# Patient Record
Sex: Female | Born: 1970 | Hispanic: No | Marital: Married | Smoking: Never smoker
Health system: Southern US, Community
[De-identification: ages and names within clinical notes are randomized; demographics above are authoritative.]

## PROBLEM LIST (undated history)

## (undated) DIAGNOSIS — J329 Chronic sinusitis, unspecified: Secondary | ICD-10-CM

## (undated) DIAGNOSIS — D72819 Decreased white blood cell count, unspecified: Secondary | ICD-10-CM

## (undated) DIAGNOSIS — C50919 Malignant neoplasm of unspecified site of unspecified female breast: Secondary | ICD-10-CM

## (undated) DIAGNOSIS — F419 Anxiety disorder, unspecified: Secondary | ICD-10-CM

## (undated) DIAGNOSIS — Z923 Personal history of irradiation: Secondary | ICD-10-CM

## (undated) DIAGNOSIS — F32A Depression, unspecified: Secondary | ICD-10-CM

## (undated) DIAGNOSIS — J9801 Acute bronchospasm: Secondary | ICD-10-CM

## (undated) DIAGNOSIS — Z8489 Family history of other specified conditions: Secondary | ICD-10-CM

## (undated) DIAGNOSIS — F329 Major depressive disorder, single episode, unspecified: Secondary | ICD-10-CM

## (undated) DIAGNOSIS — D709 Neutropenia, unspecified: Secondary | ICD-10-CM

## (undated) DIAGNOSIS — N870 Mild cervical dysplasia: Secondary | ICD-10-CM

## (undated) DIAGNOSIS — M8430XA Stress fracture, unspecified site, initial encounter for fracture: Secondary | ICD-10-CM

## (undated) HISTORY — DX: Personal history of irradiation: Z92.3

## (undated) HISTORY — DX: Malignant neoplasm of unspecified site of unspecified female breast: C50.919

## (undated) HISTORY — DX: Stress fracture, unspecified site, initial encounter for fracture: M84.30XA

## (undated) HISTORY — DX: Chronic sinusitis, unspecified: J32.9

## (undated) HISTORY — PX: COLPOSCOPY: SHX161

## (undated) HISTORY — DX: Depression, unspecified: F32.A

## (undated) HISTORY — DX: Neutropenia, unspecified: D70.9

## (undated) HISTORY — DX: Major depressive disorder, single episode, unspecified: F32.9

## (undated) HISTORY — DX: Anxiety disorder, unspecified: F41.9

## (undated) HISTORY — DX: Decreased white blood cell count, unspecified: D72.819

## (undated) HISTORY — DX: Mild cervical dysplasia: N87.0

## (undated) HISTORY — DX: Acute bronchospasm: J98.01

---
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
## 1997-08-24 ENCOUNTER — Other Ambulatory Visit: Admission: RE | Admit: 1997-08-24 | Discharge: 1997-08-24 | Payer: Self-pay | Admitting: Obstetrics and Gynecology

## 1998-09-05 ENCOUNTER — Other Ambulatory Visit: Admission: RE | Admit: 1998-09-05 | Discharge: 1998-09-05 | Payer: Self-pay | Admitting: Obstetrics and Gynecology

## 1999-09-12 ENCOUNTER — Other Ambulatory Visit: Admission: RE | Admit: 1999-09-12 | Discharge: 1999-09-12 | Payer: Self-pay | Admitting: Obstetrics and Gynecology

## 2000-09-12 ENCOUNTER — Other Ambulatory Visit: Admission: RE | Admit: 2000-09-12 | Discharge: 2000-09-12 | Payer: Self-pay | Admitting: Obstetrics and Gynecology

## 2001-10-03 ENCOUNTER — Other Ambulatory Visit: Admission: RE | Admit: 2001-10-03 | Discharge: 2001-10-03 | Payer: Self-pay | Admitting: Obstetrics and Gynecology

## 2004-04-27 ENCOUNTER — Other Ambulatory Visit: Admission: RE | Admit: 2004-04-27 | Discharge: 2004-04-27 | Payer: Self-pay | Admitting: Obstetrics and Gynecology

## 2004-09-12 ENCOUNTER — Other Ambulatory Visit: Admission: RE | Admit: 2004-09-12 | Discharge: 2004-09-12 | Payer: Self-pay | Admitting: Obstetrics and Gynecology

## 2005-05-07 ENCOUNTER — Other Ambulatory Visit: Admission: RE | Admit: 2005-05-07 | Discharge: 2005-05-07 | Payer: Self-pay | Admitting: Obstetrics and Gynecology

## 2005-11-08 ENCOUNTER — Other Ambulatory Visit: Admission: RE | Admit: 2005-11-08 | Discharge: 2005-11-08 | Payer: Self-pay | Admitting: Obstetrics and Gynecology

## 2006-05-09 ENCOUNTER — Other Ambulatory Visit: Admission: RE | Admit: 2006-05-09 | Discharge: 2006-05-09 | Payer: Self-pay | Admitting: Obstetrics and Gynecology

## 2007-05-12 ENCOUNTER — Other Ambulatory Visit: Admission: RE | Admit: 2007-05-12 | Discharge: 2007-05-12 | Payer: Self-pay | Admitting: Obstetrics and Gynecology

## 2008-05-18 ENCOUNTER — Ambulatory Visit: Payer: Self-pay | Admitting: Obstetrics and Gynecology

## 2008-05-18 ENCOUNTER — Encounter: Payer: Self-pay | Admitting: Obstetrics and Gynecology

## 2008-05-18 ENCOUNTER — Other Ambulatory Visit: Admission: RE | Admit: 2008-05-18 | Discharge: 2008-05-18 | Payer: Self-pay | Admitting: Obstetrics and Gynecology

## 2009-01-29 DIAGNOSIS — M8430XA Stress fracture, unspecified site, initial encounter for fracture: Secondary | ICD-10-CM

## 2009-01-29 HISTORY — DX: Stress fracture, unspecified site, initial encounter for fracture: M84.30XA

## 2009-04-05 ENCOUNTER — Encounter: Admission: RE | Admit: 2009-04-05 | Discharge: 2009-04-05 | Payer: Self-pay | Admitting: Sports Medicine

## 2009-05-24 ENCOUNTER — Other Ambulatory Visit: Admission: RE | Admit: 2009-05-24 | Discharge: 2009-05-24 | Payer: Self-pay | Admitting: Obstetrics and Gynecology

## 2009-05-24 ENCOUNTER — Ambulatory Visit: Payer: Self-pay | Admitting: Obstetrics and Gynecology

## 2009-09-06 ENCOUNTER — Ambulatory Visit: Payer: Self-pay | Admitting: Obstetrics and Gynecology

## 2010-03-07 ENCOUNTER — Other Ambulatory Visit: Payer: Self-pay

## 2010-03-07 DIAGNOSIS — M549 Dorsalgia, unspecified: Secondary | ICD-10-CM

## 2010-03-07 DIAGNOSIS — M25552 Pain in left hip: Secondary | ICD-10-CM

## 2010-03-10 ENCOUNTER — Ambulatory Visit
Admission: RE | Admit: 2010-03-10 | Discharge: 2010-03-10 | Disposition: A | Discharge status: Home / Self Care | Payer: Commercial Managed Care - PPO | Source: Ambulatory Visit

## 2010-03-10 DIAGNOSIS — M549 Dorsalgia, unspecified: Secondary | ICD-10-CM

## 2010-03-10 DIAGNOSIS — M25552 Pain in left hip: Secondary | ICD-10-CM

## 2010-05-29 ENCOUNTER — Other Ambulatory Visit: Payer: Self-pay | Admitting: Obstetrics and Gynecology

## 2010-05-29 ENCOUNTER — Encounter (INDEPENDENT_AMBULATORY_CARE_PROVIDER_SITE_OTHER): Payer: 59 | Admitting: Obstetrics and Gynecology

## 2010-05-29 ENCOUNTER — Other Ambulatory Visit (HOSPITAL_COMMUNITY)
Admission: RE | Admit: 2010-05-29 | Discharge: 2010-05-29 | Disposition: A | Discharge status: Home / Self Care | Payer: 59 | Source: Ambulatory Visit | Attending: Obstetrics and Gynecology | Admitting: Obstetrics and Gynecology

## 2010-05-29 DIAGNOSIS — Z13220 Encounter for screening for lipoid disorders: Secondary | ICD-10-CM

## 2010-05-29 DIAGNOSIS — Z124 Encounter for screening for malignant neoplasm of cervix: Secondary | ICD-10-CM | POA: Insufficient documentation

## 2010-05-29 DIAGNOSIS — R82998 Other abnormal findings in urine: Secondary | ICD-10-CM

## 2010-05-29 DIAGNOSIS — Z01419 Encounter for gynecological examination (general) (routine) without abnormal findings: Secondary | ICD-10-CM

## 2010-05-29 DIAGNOSIS — Z833 Family history of diabetes mellitus: Secondary | ICD-10-CM

## 2010-06-30 ENCOUNTER — Other Ambulatory Visit: Payer: 59

## 2011-05-23 ENCOUNTER — Encounter: Payer: Self-pay | Admitting: Gynecology

## 2011-05-23 DIAGNOSIS — M8430XA Stress fracture, unspecified site, initial encounter for fracture: Secondary | ICD-10-CM | POA: Insufficient documentation

## 2011-05-23 DIAGNOSIS — N870 Mild cervical dysplasia: Secondary | ICD-10-CM | POA: Insufficient documentation

## 2011-05-30 ENCOUNTER — Other Ambulatory Visit (HOSPITAL_COMMUNITY)
Admission: RE | Admit: 2011-05-30 | Discharge: 2011-05-30 | Disposition: A | Discharge status: Home / Self Care | Payer: 59 | Source: Ambulatory Visit | Attending: Obstetrics and Gynecology | Admitting: Obstetrics and Gynecology

## 2011-05-30 ENCOUNTER — Other Ambulatory Visit: Payer: Self-pay | Admitting: Obstetrics and Gynecology

## 2011-05-30 ENCOUNTER — Ambulatory Visit (INDEPENDENT_AMBULATORY_CARE_PROVIDER_SITE_OTHER): Payer: 59 | Admitting: Obstetrics and Gynecology

## 2011-05-30 ENCOUNTER — Encounter: Payer: Self-pay | Admitting: Obstetrics and Gynecology

## 2011-05-30 VITALS — BP 120/74 | Ht 66.5 in | Wt 148.0 lb

## 2011-05-30 DIAGNOSIS — J329 Chronic sinusitis, unspecified: Secondary | ICD-10-CM | POA: Insufficient documentation

## 2011-05-30 DIAGNOSIS — Z833 Family history of diabetes mellitus: Secondary | ICD-10-CM

## 2011-05-30 DIAGNOSIS — J9801 Acute bronchospasm: Secondary | ICD-10-CM | POA: Insufficient documentation

## 2011-05-30 DIAGNOSIS — Z1231 Encounter for screening mammogram for malignant neoplasm of breast: Secondary | ICD-10-CM

## 2011-05-30 DIAGNOSIS — Z01419 Encounter for gynecological examination (general) (routine) without abnormal findings: Secondary | ICD-10-CM

## 2011-05-30 DIAGNOSIS — Z113 Encounter for screening for infections with a predominantly sexual mode of transmission: Secondary | ICD-10-CM

## 2011-05-30 LAB — RPR

## 2011-05-30 LAB — HIV ANTIBODY (ROUTINE TESTING W REFLEX): HIV: NONREACTIVE

## 2011-05-30 MED ORDER — SERTRALINE HCL 50 MG PO TABS: 50.0000 mg | ORAL_TABLET | Freq: Every day | ORAL | Status: DC

## 2011-05-30 MED ORDER — ETONOGESTREL-ETHINYL ESTRADIOL 0.12-0.015 MG/24HR VA RING: VAGINAL_RING | VAGINAL | Status: DC

## 2011-05-30 NOTE — Progress Notes (Signed)
Patient came to see me today for her annual GYN exam. She is suspicious that her husband has been unfavorable to her although he denies it. She requested STD testing which I think is appropriate. She's been vaccinated against hepatitis B and did not feel she need to be checked for hepatitis A. She remains amenorrheic on her NuvaRing. She does them back to back without break and except for recently has had no bleeding. The recent bleeding she thinks was related to leaving the nuvaring in too  long. Her husband has had a vasectomy so she was not concerned about pregnancy. She took the nuvaring  out and had a withdrawal bleed and is now back on continuous Nuvaring. She is getting counseling because of the above. She continues with Zoloft without any problems. She donated blood yesterday and her hemoglobin was normal.  Physical examination: Kennon Portela present. HEENT within normal limits. Neck: Thyroid not large. No masses. Supraclavicular nodes: not enlarged. Breasts: Examined in both sitting and lying  position. No skin changes and no masses. Abdomen: Soft no guarding rebound or masses or hernia. Pelvic: External: Within normal limits. BUS: Within normal limits. Vaginal:within normal limits. Good estrogen effect. No evidence of cystocele rectocele or enterocele. Cervix: clean. Uterus: Normal size and shape. Adnexa: No masses. Rectovaginal exam: Confirmatory and negative. Extremities: Within normal limits.  Assessment: Normal GYN exam. History of CIN-1  Plan: STD testing done. Continue NuvaRing and Zoloft. Mammogram at age 62.

## 2011-05-31 LAB — URINALYSIS W MICROSCOPIC + REFLEX CULTURE
Crystals: NONE SEEN
Glucose, UA: NEGATIVE mg/dL
Nitrite: NEGATIVE
Specific Gravity, Urine: 1.018 (ref 1.005–1.030)
Squamous Epithelial / LPF: NONE SEEN
Urobilinogen, UA: 1 mg/dL (ref 0.0–1.0)

## 2011-05-31 LAB — GC/CHLAMYDIA PROBE AMP, GENITAL: GC Probe Amp, Genital: NEGATIVE

## 2011-06-01 LAB — URINE CULTURE: Colony Count: 4000

## 2011-06-17 ENCOUNTER — Telehealth: Payer: Self-pay | Admitting: Physician Assistant

## 2011-06-17 MED ORDER — MOXIFLOXACIN HCL 400 MG PO TABS: 400.0000 mg | ORAL_TABLET | Freq: Every day | ORAL | Status: AC

## 2011-06-17 MED ORDER — PREDNISONE 20 MG PO TABS: ORAL_TABLET | ORAL | Status: DC

## 2011-06-17 NOTE — Telephone Encounter (Signed)
Patient c/o 3-4 weeks of rhinorrhea and itchy eyes with evening ST and dry cough for 1 week.  Increased PND in AM for 5 days with morning sneezing. HA right posterior aspect, behind ear with right ear pressure that began 06/15/11.  This is a typical presentation with her sphenoid sinus infections she has had in the past and documented on CT. HA worsened yesterday.  Prescriptions for Avelox and Prednisone were called in to [facility] yesterday.

## 2011-06-20 ENCOUNTER — Ambulatory Visit
Admission: RE | Admit: 2011-06-20 | Discharge: 2011-06-20 | Disposition: A | Discharge status: Home / Self Care | Payer: 59 | Source: Ambulatory Visit | Attending: Obstetrics and Gynecology | Admitting: Obstetrics and Gynecology

## 2011-06-20 DIAGNOSIS — Z1231 Encounter for screening mammogram for malignant neoplasm of breast: Secondary | ICD-10-CM

## 2011-06-22 ENCOUNTER — Other Ambulatory Visit: Payer: Self-pay | Admitting: *Deleted

## 2011-06-22 DIAGNOSIS — R921 Mammographic calcification found on diagnostic imaging of breast: Secondary | ICD-10-CM

## 2011-06-29 ENCOUNTER — Ambulatory Visit
Admission: RE | Admit: 2011-06-29 | Discharge: 2011-06-29 | Disposition: A | Discharge status: Home / Self Care | Payer: 59 | Source: Ambulatory Visit | Attending: Obstetrics and Gynecology | Admitting: Obstetrics and Gynecology

## 2011-06-29 DIAGNOSIS — R921 Mammographic calcification found on diagnostic imaging of breast: Secondary | ICD-10-CM

## 2011-10-17 ENCOUNTER — Other Ambulatory Visit: Payer: Self-pay | Admitting: Emergency Medicine

## 2011-10-17 NOTE — Telephone Encounter (Signed)
#  [phone], needs OV for more

## 2011-11-27 ENCOUNTER — Other Ambulatory Visit: Payer: Self-pay | Admitting: *Deleted

## 2011-11-27 DIAGNOSIS — R921 Mammographic calcification found on diagnostic imaging of breast: Secondary | ICD-10-CM

## 2011-12-03 ENCOUNTER — Other Ambulatory Visit: Payer: Self-pay | Admitting: Obstetrics and Gynecology

## 2011-12-03 ENCOUNTER — Ambulatory Visit
Admission: RE | Admit: 2011-12-03 | Discharge: 2011-12-03 | Disposition: A | Discharge status: Home / Self Care | Payer: 59 | Source: Ambulatory Visit | Attending: Obstetrics and Gynecology | Admitting: Obstetrics and Gynecology

## 2011-12-03 DIAGNOSIS — N63 Unspecified lump in unspecified breast: Secondary | ICD-10-CM

## 2011-12-03 DIAGNOSIS — R921 Mammographic calcification found on diagnostic imaging of breast: Secondary | ICD-10-CM

## 2011-12-13 ENCOUNTER — Other Ambulatory Visit: Payer: Self-pay | Admitting: Obstetrics and Gynecology

## 2011-12-13 ENCOUNTER — Ambulatory Visit
Admission: RE | Admit: 2011-12-13 | Discharge: 2011-12-13 | Disposition: A | Discharge status: Home / Self Care | Payer: 59 | Source: Ambulatory Visit | Attending: Obstetrics and Gynecology | Admitting: Obstetrics and Gynecology

## 2011-12-13 DIAGNOSIS — N63 Unspecified lump in unspecified breast: Secondary | ICD-10-CM

## 2011-12-13 DIAGNOSIS — C50919 Malignant neoplasm of unspecified site of unspecified female breast: Secondary | ICD-10-CM

## 2011-12-13 HISTORY — DX: Malignant neoplasm of unspecified site of unspecified female breast: C50.919

## 2011-12-13 HISTORY — PX: BREAST BIOPSY: SHX20

## 2011-12-14 ENCOUNTER — Other Ambulatory Visit: Payer: Self-pay | Admitting: Obstetrics and Gynecology

## 2011-12-14 DIAGNOSIS — C50912 Malignant neoplasm of unspecified site of left female breast: Secondary | ICD-10-CM

## 2011-12-17 ENCOUNTER — Ambulatory Visit (HOSPITAL_COMMUNITY)
Admission: RE | Admit: 2011-12-17 | Discharge: 2011-12-17 | Disposition: A | Discharge status: Home / Self Care | Payer: 59 | Source: Ambulatory Visit | Attending: Obstetrics and Gynecology | Admitting: Obstetrics and Gynecology

## 2011-12-17 ENCOUNTER — Other Ambulatory Visit: Payer: Self-pay | Admitting: *Deleted

## 2011-12-17 ENCOUNTER — Telehealth: Payer: Self-pay | Admitting: *Deleted

## 2011-12-17 ENCOUNTER — Ambulatory Visit
Admission: RE | Admit: 2011-12-17 | Discharge: 2011-12-17 | Disposition: A | Discharge status: Home / Self Care | Payer: 59 | Source: Ambulatory Visit | Attending: Obstetrics and Gynecology | Admitting: Obstetrics and Gynecology

## 2011-12-17 DIAGNOSIS — Z853 Personal history of malignant neoplasm of breast: Secondary | ICD-10-CM | POA: Insufficient documentation

## 2011-12-17 DIAGNOSIS — C50912 Malignant neoplasm of unspecified site of left female breast: Secondary | ICD-10-CM

## 2011-12-17 DIAGNOSIS — C50519 Malignant neoplasm of lower-outer quadrant of unspecified female breast: Secondary | ICD-10-CM

## 2011-12-17 DIAGNOSIS — C50919 Malignant neoplasm of unspecified site of unspecified female breast: Secondary | ICD-10-CM | POA: Insufficient documentation

## 2011-12-17 MED ORDER — GADOBENATE DIMEGLUMINE 529 MG/ML IV SOLN
14.0000 mL | Freq: Once | INTRAVENOUS | Status: AC | PRN
  Administered 2011-12-17: 14 mL via INTRAVENOUS

## 2011-12-17 NOTE — Telephone Encounter (Signed)
Confirmed [facility] for 12/19/11 at 0800.  Instructions and contact information given.  

## 2011-12-18 ENCOUNTER — Encounter (INDEPENDENT_AMBULATORY_CARE_PROVIDER_SITE_OTHER): Payer: Self-pay | Admitting: Surgery

## 2011-12-19 ENCOUNTER — Ambulatory Visit (HOSPITAL_BASED_OUTPATIENT_CLINIC_OR_DEPARTMENT_OTHER): Payer: Commercial Managed Care - PPO | Admitting: Surgery

## 2011-12-19 ENCOUNTER — Encounter (INDEPENDENT_AMBULATORY_CARE_PROVIDER_SITE_OTHER): Payer: Self-pay | Admitting: Surgery

## 2011-12-19 ENCOUNTER — Encounter: Payer: Self-pay | Admitting: *Deleted

## 2011-12-19 ENCOUNTER — Other Ambulatory Visit: Payer: 59

## 2011-12-19 ENCOUNTER — Encounter: Payer: Self-pay | Admitting: Oncology

## 2011-12-19 ENCOUNTER — Ambulatory Visit (HOSPITAL_BASED_OUTPATIENT_CLINIC_OR_DEPARTMENT_OTHER): Payer: 59 | Admitting: Oncology

## 2011-12-19 ENCOUNTER — Ambulatory Visit (HOSPITAL_BASED_OUTPATIENT_CLINIC_OR_DEPARTMENT_OTHER): Payer: 59

## 2011-12-19 ENCOUNTER — Ambulatory Visit: Payer: 59 | Admitting: Genetic Counselor

## 2011-12-19 ENCOUNTER — Ambulatory Visit: Payer: 59 | Attending: Surgery | Admitting: Physical Therapy

## 2011-12-19 ENCOUNTER — Other Ambulatory Visit (HOSPITAL_BASED_OUTPATIENT_CLINIC_OR_DEPARTMENT_OTHER): Payer: 59 | Admitting: Lab

## 2011-12-19 ENCOUNTER — Ambulatory Visit
Admission: RE | Admit: 2011-12-19 | Discharge: 2011-12-19 | Disposition: A | Discharge status: Home / Self Care | Payer: 59 | Source: Ambulatory Visit | Attending: Radiation Oncology | Admitting: Radiation Oncology

## 2011-12-19 ENCOUNTER — Encounter: Payer: Self-pay | Admitting: Genetic Counselor

## 2011-12-19 VITALS — BP 110/74 | HR 80 | Temp 98.3°F | Resp 18 | Ht 66.5 in | Wt 156.0 lb

## 2011-12-19 VITALS — BP 110/74 | HR 80 | Temp 98.3°F | Resp 20 | Ht 66.5 in | Wt 155.7 lb

## 2011-12-19 DIAGNOSIS — IMO0001 Reserved for inherently not codable concepts without codable children: Secondary | ICD-10-CM | POA: Insufficient documentation

## 2011-12-19 DIAGNOSIS — C50519 Malignant neoplasm of lower-outer quadrant of unspecified female breast: Secondary | ICD-10-CM

## 2011-12-19 DIAGNOSIS — M25619 Stiffness of unspecified shoulder, not elsewhere classified: Secondary | ICD-10-CM | POA: Insufficient documentation

## 2011-12-19 DIAGNOSIS — C50919 Malignant neoplasm of unspecified site of unspecified female breast: Secondary | ICD-10-CM | POA: Insufficient documentation

## 2011-12-19 DIAGNOSIS — Z170 Estrogen receptor positive status [ER+]: Secondary | ICD-10-CM

## 2011-12-19 DIAGNOSIS — C50219 Malignant neoplasm of upper-inner quadrant of unspecified female breast: Secondary | ICD-10-CM

## 2011-12-19 LAB — COMPREHENSIVE METABOLIC PANEL (CC13)
Alkaline Phosphatase: 50 U/L (ref 40–150)
BUN: 12 mg/dL (ref 7.0–26.0)
CO2: 29 mEq/L (ref 22–29)
Creatinine: 0.9 mg/dL (ref 0.6–1.1)
Glucose: 95 mg/dl (ref 70–99)
Total Bilirubin: 0.39 mg/dL (ref 0.20–1.20)
Total Protein: 6.7 g/dL (ref 6.4–8.3)

## 2011-12-19 LAB — CBC WITH DIFFERENTIAL/PLATELET
Basophils Absolute: 0 10*3/uL (ref 0.0–0.1)
EOS%: 3.6 % (ref 0.0–7.0)
Eosinophils Absolute: 0.1 10*3/uL (ref 0.0–0.5)
HGB: 14.2 g/dL (ref 11.6–15.9)
LYMPH%: 35.9 % (ref 14.0–49.7)
MCH: 31.2 pg (ref 25.1–34.0)
MCV: 90.1 fL (ref 79.5–101.0)
MONO%: 7.7 % (ref 0.0–14.0)
NEUT#: 1.9 10*3/uL (ref 1.5–6.5)
NEUT%: 52.4 % (ref 38.4–76.8)
Platelets: 198 10*3/uL (ref 145–400)
RDW: 12.7 % (ref 11.2–14.5)

## 2011-12-19 NOTE — Progress Notes (Signed)
Dr.  Pierce Crane requested a consultation for genetic counseling and risk assessment for Tina Parks, a 41 y.o. female, for discussion of her personal history of breast cancer and family history of breast and ovarian cancer. She presents to clinic today to discuss the possibility of a genetic predisposition to cancer, and to further clarify her risks, as well as her family members' risks for cancer.   HISTORY OF PRESENT ILLNESS: In 2013, at the age of 41, Tina Parks was diagnosed with invasive ductal carcinoma of the breast. This will be treated with surgery, sentinal node biopsy and radiation.    Past Medical History  Diagnosis Date  . CIN I (cervical intraepithelial neoplasia I)   . Stress fracture 2011    Left Hip  . Depression   . Anxiety   . Sinusitis   . Bronchial spasm     Exercise induced  . Breast cancer     Past Surgical History  Procedure Date  . Colposcopy     History  Substance Use Topics  . Smoking status: Never Smoker   . Smokeless tobacco: Not on file  . Alcohol Use: 1.5 oz/week    3 drink(s) per week    REPRODUCTIVE HISTORY AND PERSONAL RISK ASSESSMENT FACTORS: Menarche was at age 75.   Premenopausal Uterus Intact: Yes Ovaries Intact: Yes G1P1A0 , first live birth at age 56  She has not previously undergone treatment for infertility.   OCP use for 4-5 years   She has not used HRT in the past.    FAMILY HISTORY:  We obtained a detailed, 4-generation family history.  Significant diagnoses are listed below: Family History  Problem Relation Age of Onset  . Diabetes Paternal Uncle   . Hypertension Maternal Grandmother   . Heart disease Maternal Grandmother   . Breast cancer Maternal Grandmother 55  . Dementia Maternal Grandmother   . Heart disease Paternal Grandmother   . Heart disease Paternal Grandfather   . Ovarian cancer Other     dx in her 70s  The patient was diagnosed with breast cancer at age 75.  She has a brother who is  healthy and has no children.  Her mother is healthy and has three healthy brothers.  The patient's maternal grandmother had breast cancer at age 12 and died at 92.  Her grandmother's sister died of ovarian cancer in her 71s.  There is no other reported family history of cancer.  Patient's maternal ancestors are of [place], [place] and [place] descent, and paternal ancestors are of [place] and [place] descent. There is reported Ashkenazi Jewish ancestry. There is no  known consanguinity.  GENETIC COUNSELING RISK ASSESSMENT, DISCUSSION, AND SUGGESTED FOLLOW UP: We reviewed the natural history and genetic etiology of sporadic, familial and hereditary cancer syndromes.  About 5-10% of breast cancer is hereditary.  Of this, about 85% is the result of a BRCA1 or BRCA2 mutation.  We reviewed the red flags of hereditary cancer syndromes and the dominant inheritance patterns.  If the BRCA testing is negative, we discussed that we could be testing for the wrong gene.  We discussed gene panels, and that several cancer genes that are associated with different cancers can be tested at the same time.  Because of the different types of cancer that are in the patient's family, we will consider the Breast/Ovarian cancer panel.   The patient's personal history of breast cancer is suggestive of the following possible diagnosis: hereditary cancer syndrome  We discussed  that identification of a hereditary cancer syndrome may help her care providers tailor the patients medical management. If a mutation indicating a hereditary cancer syndrome is detected in this case, the [employer] recommendations would include increased cancer surveillance and possible prophylactic surgery. If a mutation is detected, the patient will be referred back to the referring provider and to any additional appropriate care providers to discuss the relevant options.   If a mutation is not found in the patient, this will  decrease the likelihood of a hereditary cancer syndrome as the explanation for her breast cancer. Cancer surveillance options would be discussed for the patient according to the appropriate standard National Comprehensive Cancer Network and American Cancer Society guidelines, with consideration of their personal and family history risk factors. In this case, the patient will be referred back to their care providers for discussions of management.   In order to estimate her chance of having a BRCA mutation, we used statistical models (Penn II) and laboratory data that take into account her personal medical history, family history and ancestry.  Because each model is different, there can be a lot of variability in the risks they give.  Therefore, these numbers must be considered a rough range and not a precise risk of having a BRCA mutation.  This model estimates that she has up to a 11% chance of having a mutation. Based on this assessment of her family and personal history, genetic testing is recommended.  After considering the risks, benefits, and limitations, the patient provided informed consent for  the following  testing: Breast/Ovarian Cancer Panel through [facility].   Per the patient's request, we will contact her by telephone to discuss these results. A follow up genetic counseling visit will be scheduled if indicated.  The patient was seen for a total of 60 minutes, greater than 50% of which was spent face-to-face counseling.  This plan is being carried out per Dr. Theron Arista Rubin's recommendations.  This note will also be sent to the referring provider via the electronic medical record. The patient will be supplied with a summary of this genetic counseling discussion as well as educational information on the discussed hereditary cancer syndromes following the conclusion of their visit.   Patient was discussed with Dr. Drue Second.   _______________________________________________________________________ For Office Staff:  Number of people involved in session: 3 Was an Intern/ student involved with case: no

## 2011-12-19 NOTE — Progress Notes (Signed)
Checked in new patient. No financial issues. She didn't have [facility] packet--gave to her to fill out.

## 2011-12-19 NOTE — Progress Notes (Signed)
Radiation Oncology         [phone] ________________________________  Initial outpatient Consultation  Name: Tina Parks MRN: [id number]  Date: 12/19/2011  DOB: 27-Oct-1970  REFERRING PHYSICIAN: Currie Paris, MD  DIAGNOSIS: [id number] Invasive Ductal Carcinoma   HISTORY OF PRESENT ILLNESS::Tina Parks is a 41 y.o. female  who was recalled for calcifications. She had a mammogram in may of this back for followup imaging in November. Calcifications measuring about 5 mm were noted in the very posterior aspect of the left breast. MRI confirmed a 7 mm area of enhancement. Enhancement was noted into the pectoralis muscle DT biopsy. The biopsy was positive for grade 1 invasive ductal carcinoma. This was ER/PR positive and HER-2 negative. Her Ki-67 was less than 5%. She had no breast related complaints or symptoms prior to her mammogram.  She has never had cancer before. Never received radiation. She is GX P1 and continues to be menstruating. She denies any hormone replacement use.  PREVIOUS RADIATION THERAPY: No  PAST MEDICAL HISTORY:  has a past medical history of CIN I (cervical intraepithelial neoplasia I); Stress fracture (2011); Depression; Anxiety; Sinusitis; and Bronchial spasm.    PAST SURGICAL HISTORY: Past Surgical History  Procedure Date  . Colposcopy     FAMILY HISTORY: family history includes Breast cancer in her maternal grandmother; Dementia in her maternal grandmother; Diabetes in her paternal uncle; Heart disease in her maternal grandmother, paternal grandfather, and paternal grandmother; and Hypertension in her maternal grandmother.  SOCIAL HISTORY:  reports that she has never smoked. She does not have any smokeless tobacco history on file. She reports that she drinks about 1.5 ounces of alcohol per week. She reports that she does not use illicit drugs.  ALLERGIES: Review of patient's allergies indicates no known allergies.  MEDICATIONS:  Current Outpatient  Prescriptions  Medication Sig Dispense Refill  . ergocalciferol (VITAMIN D2) 50000 UNITS capsule Take 50,000 Units by mouth every 14 (fourteen) days.      Marland Kitchen etonogestrel-ethinyl estradiol (NUVARING) 0.12-0.015 MG/24HR vaginal ring Change monthly with no break  3 each  5  . montelukast (SINGULAIR) 10 MG tablet TAKE 1 TABLET BY MOUTH DAILY  90 tablet  3  . predniSONE (DELTASONE) 20 MG tablet 3 x3, 2x3, 1x3  18 tablet  0  . sertraline (ZOLOFT) 50 MG tablet Take 1 tablet (50 mg total) by mouth daily.  90 tablet  4    REVIEW OF SYSTEMS:  A 15 point review of systems is documented in the electronic medical record. This was obtained by the nursing staff. However, I reviewed this with the patient to discuss relevant findings and make appropriate changes.  Pertinent items are noted in HPI.   PHYSICAL EXAM:  Wt Readings from Last 3 Encounters:  12/19/11 155 lb 11.2 oz (70.625 kg)  05/30/11 148 lb (67.132 kg)   Temp Readings from Last 3 Encounters:  12/19/11 98.3 F (36.8 C) Oral   BP Readings from Last 3 Encounters:  12/19/11 110/74  05/30/11 120/74   Pulse Readings from Last 3 Encounters:  12/19/11 80   She is a pleasant female in no distress sitting comfortably examining table. She has bruising associated with her biopsy site. She has a palpable biopsy changes in the central aspect of the upper breast. She has no palpable adenopathy in her bilateral axilla are bilateral supraclavicular fossa. She is alert and oriented x3. She has normal range of motion in her bilateral upper extremities. She has no lymphedema.  LABORATORY DATA:  Lab Results  Component Value Date   WBC 3.7* 12/19/2011   HGB 14.2 12/19/2011   HCT 40.9 12/19/2011   MCV 90.1 12/19/2011   PLT 198 12/19/2011   Lab Results  Component Value Date   NA 139 12/19/2011   K 4.1 12/19/2011   CL 105 12/19/2011   CO2 29 12/19/2011   Lab Results  Component Value Date   ALT 35 12/19/2011   AST 22 12/19/2011   ALKPHOS 50  12/19/2011   BILITOT 0.39 12/19/2011     RADIOGRAPHY: US Breast Left  12/12/2011  **ADDENDUM** CREATED: 12/03/2011 10:06:10  THE INITIAL REPORT ON THIS PATIENT IS INCORRECT.  THE CORRECT REPORT IS AS FOLLOWS:  *RADIOLOGY REPORT*  Clinical Data:  The patient returns for short interval follow-up of calcifications in the left upper inner quadrant posteriorly.  DIGITAL DIAGNOSTIC LEFT MAMMOGRAM WITH CAD AND LEFT BREAST ULTRASOUND:  Comparison:  06/20/2011, 06/29/2011  Findings:  The breast tissue is heterogeneously dense. Calcifications in the left upper inner quadrant posteriorly very slightly in size and shape today.  They may be associated with a small mass. Mammographic images were processed with CAD.  On physical exam, no mass is palpated in the left upper inner quadrant.  Ultrasound is performed, showing an ill-defined hypoechoic mass with calcifications at 10 o'clock 9 cm from the left nipple measuring 0.7 x 0.6 x 0.7 cm.  No abnormal nodes are noted in the left axilla.  The appearance is suspicious and biopsy is suggested. Ultrasound-guided core needle biopsy was discussed with the patient and she agreed with this plan.  IMPRESSION: Hypoechoic mass containing microcalcifications at 10 o'clock 9 cm from the left nipple.  RECOMMENDATION: Biopsy is suggested.  Ultrasound-guided core needle biopsy has been scheduled for 12/13/2011.  I have discussed the findings and recommendations with the patient. Results were also provided in writing at the conclusion of the visit.  BI-RADS CATEGORY 4:  Suspicious abnormality - biopsy should be considered.  Addended by:  Cain Saupe, M.D. on 12/03/2011 10:06:10.  **END ADDENDUM** SIGNED BY: Cain Saupe, M.D.   12/12/2011  *RADIOLOGY REPORT*  Clinical Data:  Ultrasound-guided core needle biopsy of a mass at 8 o'clock 5 cm from the right nipple with clip placement.  DIGITAL DIAGNOSTIC RIGHT MAMMOGRAM  Comparison:  Previous exams.  Findings:  Films are performed  following ultrasound guided biopsy of a mass at 8 o'clock 5 cm from the right nipple.  The ribbon clip is appropriately positioned.  IMPRESSION: Appropriate clip placement following ultrasound-guided core needle biopsy of a mass at 8 o'clock 5 cm from the right nipple.   Original Report Authenticated By: Cain Saupe, M.D.    Mr Breast Bilateral W Wo Contrast  12/18/2011  *RADIOLOGY REPORT*  Clinical Data: Recent diagnosis of in situ and invasive left breast ductal carcinoma, status post ultrasound-guided biopsy of a 7 mm mass with internal calcifications.  BILATERAL BREAST MRI WITH AND WITHOUT CONTRAST  Technique: Multiplanar, multisequence MR images of both breasts were obtained prior to and following the intravenous administration of 14ml of Multihance.  Three dimensional images were evaluated at the independent DynaCad workstation.  Comparison:  Right mammogram 12/17/2011, post-biopsy clip mammogram of the left breast 12/13/2011, diagnostic left mammogram left breast ultrasound of 12/03/2011, and ultrasound-guided core needle biopsy of the left breast 12/13/2011  Findings: T2-weighted images show scattered cysts bilaterally. There is a moderate background parenchymal enhancement pattern bilaterally with multiple enhancing foci scattered throughout both breasts.  In  the deep upper inner left breast, 10 o'clock position, is an oval 6 mm greatest diameter T2 bright mass. There is mild clumped enhancement in this area on postcontrast images with continuous type kinetics.  The enhancement measures approximately 7 x 7 x 6 mm and corresponds to the biopsy-proven malignancy.  Biopsy clip artifact is vaguely seen within the area of enhancement.  It is noted that the biopsy clip is best visualized on mammogram.  The enhancement associated with the malignancy is not significantly greater than the patient's background parenchymal enhancement pattern.  Directly posterior to the biopsy-proven malignancy is a very focal  area of T2 bright signal within the left pectoralis muscle.  On post-contrast images, there is associated focal enhancement in this region of T2 abnormality in the pectoralis muscle. The area of pectoralis enhancement measures 7 x 5 x 5 mm.  Bilateral axillary lymph nodes appears symmetric.  No enlarged or otherwise suspicious lymph node is identified.  No internal mammary chain lymphadenopathy.  IMPRESSION:  1.  Vague area of stippled enhancement and increased T2 signal in the deep upper inner left breast measures approximately 7 mm greatest diameter and corresponds to the biopsy-proven malignancy. Directly posterior to the biopsied mass is focal enhancement and T2 signal abnormality in the left pectoralis muscle, for which pectoralis muscle involvement by malignancy cannot be excluded. 2.  Complex parenchymal enhancement pattern bilaterally.  This can limit the sensitivity for detecting malignancy by MRI.  No specific evidence of malignancy is identified in the right breast. 3.  No evidence of lymphadenopathy.  RECOMMENDATION: It ends treatment planning for known left breast carcinoma  THREE-DIMENSIONAL MR IMAGE RENDERING ON INDEPENDENT WORKSTATION:  Three-dimensional MR images were rendered by post-processing of the original MR data on an independent workstation.  The three- dimensional MR images were interpreted, and findings were reported in the accompanying complete MRI report for this study.  BI-RADS CATEGORY 6:  Known biopsy-proven malignancy - appropriate action should be taken.   Original Report Authenticated By: Britta Mccreedy, M.D.    [place] Core Biopsy  12/14/2011  **ADDENDUM** CREATED: 12/14/2011 11:34:21  Pathologic results have become available and indicate in-situ and invasive ductal carcinoma, which is concordant with the imaging appearance.  I spoke with the patient by telephone at 11:30 today, and discussed these results.  As we discussed, we will get her an appointment for breast mri at [facility], and an appointment with the [facility], and will contact her with these dates and times.  She denied any significant bruising or tenderness today.  **END ADDENDUM** SIGNED BY: Esperanza Heir, M.D.   12/13/2011  *RADIOLOGY REPORT*  Clinical Data:  7 mm hypoechoic mass with calcifications at 10 o'clock 9 cm from the left nipple.  ULTRASOUND GUIDED VACUUM ASSISTED CORE BIOPSY OF THE LEFT BREAST  The patient and I discussed the procedure of ultrasound-guided biopsy, including benefits and alternatives.  We discussed the high likelihood of a successful procedure. We discussed the risks of the procedure including infection, bleeding, tissue injury, clip migration, and inadequate sampling.  Informed written consent was given.  Using sterile technique, 2% lidocaine, ultrasound guidance, and a 12 gauge vacuum assisted needle, biopsy was performed of the mass with calcifications at 10 o'clock 9 cm from the left nipple using a lateromedial approach.  At the conclusion of the procedure, a Hydromark coil tissue marker clip was deployed into the biopsy cavity.  Follow-up 2-view mammogram was performed and dictated separately.  IMPRESSION: Ultrasound-guided biopsy of a mass  at 10 o'clock 9 cm from the left nipple.  No apparent complications.   Original Report Authenticated By: Cain Saupe, M.D.    Mm Digital Diagnostic Unilat L  12/13/2011  *RADIOLOGY REPORT*  Clinical Data:  Ultrasound-guided core needle biopsy of a 7 mm mass with microcalcifications at 10 o'clock 9 cm from the left nipple with clip placement.  DIGITAL DIAGNOSTIC LEFT MAMMOGRAM  Comparison:  Previous exams.  Findings:  Films are performed following ultrasound guided biopsy of a 7 mm mass with microcalcifications at 10 o'clock, 9 cm from the left nipple.  The [facility] coil clip is appropriately positioned.  IMPRESSION: Appropriate clip placement following ultrasound-guided core needle biopsy of a mass with microcalcifications at 10 o'clock 9 cm from the  left nipple.   Original Report Authenticated By: Cain Saupe, M.D.    Mm Digital Diagnostic Unilat L  12/03/2011  **ADDENDUM** CREATED: 12/03/2011 10:06:10  THE INITIAL REPORT ON THIS PATIENT IS INCORRECT.  THE CORRECT REPORT IS AS FOLLOWS:  *RADIOLOGY REPORT*  Clinical Data:  The patient returns for short interval follow-up of calcifications in the left upper inner quadrant posteriorly.  DIGITAL DIAGNOSTIC LEFT MAMMOGRAM WITH CAD AND LEFT BREAST ULTRASOUND:  Comparison:  06/20/2011, 06/29/2011  Findings:  The breast tissue is heterogeneously dense. Calcifications in the left upper inner quadrant posteriorly very slightly in size and shape today.  They may be associated with a small mass. Mammographic images were processed with CAD.  On physical exam, no mass is palpated in the left upper inner quadrant.  Ultrasound is performed, showing an ill-defined hypoechoic mass with calcifications at 10 o'clock 9 cm from the left nipple measuring 0.7 x 0.6 x 0.7 cm.  No abnormal nodes are noted in the left axilla.  The appearance is suspicious and biopsy is suggested. Ultrasound-guided core needle biopsy was discussed with the patient and she agreed with this plan.  IMPRESSION: Hypoechoic mass containing microcalcifications at 10 o'clock 9 cm from the left nipple.  RECOMMENDATION: Biopsy is suggested.  Ultrasound-guided core needle biopsy has been scheduled for 12/13/2011.  I have discussed the findings and recommendations with the patient. Results were also provided in writing at the conclusion of the visit.  BI-RADS CATEGORY 4:  Suspicious abnormality - biopsy should be considered.  Addended by:  Cain Saupe, M.D. on 12/03/2011 10:06:10.  **END ADDENDUM** SIGNED BY: Cain Saupe, M.D.   12/03/2011  *RADIOLOGY REPORT*  Clinical Data:  Ultrasound-guided core needle biopsy of a mass at 8 o'clock 5 cm from the right nipple with clip placement.  DIGITAL DIAGNOSTIC RIGHT MAMMOGRAM  Comparison:  Previous exams.   Findings:  Films are performed following ultrasound guided biopsy of a mass at 8 o'clock 5 cm from the right nipple.  The ribbon clip is appropriately positioned.  IMPRESSION: Appropriate clip placement following ultrasound-guided core needle biopsy of a mass at 8 o'clock 5 cm from the right nipple.   Original Report Authenticated By: Cain Saupe, M.D.    Mm Digital Diagnostic Unilat R  12/17/2011  *RADIOLOGY REPORT*  Clinical Data:  Recent diagnosis of invasive ductal carcinoma in the left upper inner quadrant.  Right mammogram is obtained for correlation with breast MRI.  DIGITAL DIAGNOSTIC RIGHT MAMMOGRAM WITH CAD  Comparison:  06/20/2011  Findings:  The breast tissue is heterogeneously dense.  There is no suspicious dominant mass, architectural distortion or calcification to suggest malignancy. Mammographic images were processed with CAD.  IMPRESSION: No mammographic evidence of malignancy in the right breast.  RECOMMENDATION: Yearly mammography is suggested.  I have discussed the findings and recommendations with the patient. Results were also provided in writing at the conclusion of the visit.  BI-RADS CATEGORY 1:  Negative.   Original Report Authenticated By: Cain Saupe, M.D.       IMPRESSION: T1N0 Invasive Ductal Carcinoma of the left breast  PLAN: I discussed with the patient and her husband her diagnosis and options for treatment. We discussed the randomized trials showing equivalency in terms of survival between mastectomy and lumpectomy and radiation. We discussed the process of simulation the placement tattoos. We discussed the increased local failure seen in patients who undergo lumpectomy alone. We discussed 6 weeks of treatment as an outpatient. We discussed the use of breath will technique to avoid the heart. We discussed skin edema, redness and fatigue as common acute side effects. We discussed the possibility of lung damage and secondary malignancies as long-term toxicities. She  met with Dr. Jamey Ripa as well as Dr. Donnie Coffin and our physical therapist as well as member of our patient family support staff.  I spent 60 minutes  face to face with the patient and more than 50% of that time was spent in counseling and/or coordination of care.   ------------------------------------------------  Lurline Hare, MD

## 2011-12-19 NOTE — Progress Notes (Signed)
[facility] Psychosocial Distress Screening Clinical Social Work  Patient completed distress screening protocol, and scored a 5 on the Psychosocial Distress Thermometer which indicates moderate distress. Clinical Social Worker met with patient and pt's husband in [facility] to assess for distress and other psychosocial needs.  Pt stated her distress level was much lower after speaking with the physicians and getting information on her treatment plan.  Pt did not express any urgent needs at this time.  CSW reviewed the support services available and the [facility] support team. CSW encouraged pt or family to call with any questions or concerns.    Tamala Julian, MSW, LCSW Clinical Social Worker [facility] [phone]

## 2011-12-19 NOTE — Patient Instructions (Signed)
We will schedule surgery - a lumpectomy and removal of sentinel lymph nodes from the left breast. We'll try to do this a week or so after the results of the genetic testing are back. Please call the office if you have any questions about this.

## 2011-12-19 NOTE — Progress Notes (Signed)
Tina Parks [id number] May 05, 1970 41 y.o. 12/19/2011 1:16 PM  CC  [employer] [address] [place] [address]  REASON FOR CONSULTATION:  Breast Cancer Patient was seen in the Multidisciplinary Breast Clinic for discussion of her treatment options. She was seen by Dr. Drue Second, Radiation Oncologist [facility]  STAGE:   Cancer, Left breast, lower inner quadrant,    Primary site: Breast (Left)   Staging method: AJCC 7th Edition   Clinical: Stage IA (T108mic, N0, cM0) signed by Currie Paris, MD on 12/18/2011  6:26 PM   Summary: Stage IA (T80mic, N0, cM0)  REFERRING PHYSICIAN: Dr Marisue Humble  HISTORY OF PRESENT ILLNESS:  Tina Parks is a 41 y.o. female.  From [place] in good health. She had a baseline mammogram on 06/29/2011 short followup is recommended. A followup mammogram in November of this year with ultrasound showed ill-defined hypoechoic mass with calcifications in o'clock position 9 cm from the left nipple measuring 7 x 6 x 7 mm. No other abnormalities were seen. 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 MRI scan both breasts performed on 12/17/2011 showed a vague area of enhancement measuring about 7 mm with a clip artifact. No other abnormalities were seen.   Past Medical History:  Past Medical History  Diagnosis Date  . CIN I (cervical intraepithelial neoplasia I)   . Stress fracture 2011    Left Hip  . Depression   . Anxiety   . Sinusitis   . Bronchial spasm     Exercise induced  . Breast cancer     Past Surgical History:  Past Surgical History  Procedure Date  . Colposcopy     Family History:  Family History  Problem Relation Age of Onset  . Diabetes Paternal Uncle   . Hypertension Maternal Grandmother   . Heart disease Maternal Grandmother   . Breast cancer Maternal Grandmother     Age 33    . Dementia Maternal Grandmother   . Heart disease Paternal Grandmother   . Heart disease Paternal Grandfather     Social History  History  Substance Use Topics  . Smoking status: Never Smoker   . Smokeless tobacco: Not on file  . Alcohol Use: 1.5 oz/week    3 drink(s) per week   she is married to her husband Kathlene November over the past 12 years, then the inner half-year-old child. 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 Her husband also PA. Her parents both living. Grandmother had breast cancer diagnosed age 1. One brother in good health.  Allergies:  No Known Allergies  Current Medications:  Current Outpatient Prescriptions  Medication Sig Dispense Refill  . montelukast (SINGULAIR) 10 MG tablet TAKE 1 TABLET BY MOUTH DAILY  90 tablet  3  . sertraline (ZOLOFT) 50 MG tablet Take 1 tablet (50 mg total) by mouth daily.  90 tablet  4  . ergocalciferol (VITAMIN D2) 50000 UNITS capsule Take 50,000 Units by mouth every 14 (fourteen) days.      Marland Kitchen etonogestrel-ethinyl estradiol (NUVARING) 0.12-0.015 MG/24HR vaginal ring Change monthly with no break  3 each  5  . predniSONE (DELTASONE) 20 MG tablet 3 x3, 2x3, 1x3  18 tablet  0    OB/GYN History:  G1P1 Continues to have normal periods. She was on birth control from 1990 05/31/2011 and stopped last month. That  time she is using NuvaRing. Her husband has had a vasectomy.  Fertility Discussion: no Prior History of Cancer: no  Health Maintenance:  Colonoscopy no  Bone Density no  Last PAP smear yes  ECOG PERFORMANCE STATUS: 0 - Asymptomatic  Genetic Counseling/testing: planned  REVIEW OF SYSTEMS:  A comprehensive review of systems was negative.  PHYSICAL EXAMINATION: Blood pressure 110/74, pulse 80, temperature 98.3 F (36.8 C), temperature source Oral, resp. rate 20, height 5' 6.5" (1.689 m), weight 155 lb 11.2 oz (70.625 kg), last menstrual period 12/05/2011.  HEENT:  Sclerae anicteric, conjunctivae pink.  Oropharynx clear.   No mucositis or candidiasis.  Nodes:  No cervical, supraclavicular, or axillary lymphadenopathy palpated.  Breast Exam:  Right breast is benign.  No masses, discharge, skin change, or nipple inversion.  Left breast is benign.  No masses, discharge, skin change, or nipple inversion..  Lungs:  Clear to auscultation bilaterally.  No crackles, rhonchi, or wheezes.  Heart:  Regular rate and rhythm.  Abdomen:  Soft, nontender.  Positive bowel sounds.  No organomegaly or masses palpated.  Musculoskeletal:  No focal spinal tenderness to palpation.  Extremities:  Benign.  No peripheral edema or cyanosis.  Skin:  Benign.  Neuro:  Nonfocal.      STUDIES/RESULTS: US Breast Left  25-Dec-2011  **ADDENDUM** CREATED: 12/03/2011 10:06:10  THE INITIAL REPORT ON THIS PATIENT IS INCORRECT.  THE CORRECT REPORT IS AS FOLLOWS:  *RADIOLOGY REPORT*  Clinical Data:  The patient returns for short interval follow-up of calcifications in the left upper inner quadrant posteriorly.  DIGITAL DIAGNOSTIC LEFT MAMMOGRAM WITH CAD AND LEFT BREAST ULTRASOUND:  Comparison:  06/20/2011, 06/29/2011  Findings:  The breast tissue is heterogeneously dense. Calcifications in the left upper inner quadrant posteriorly very slightly in size and shape today.  They may be associated with a small mass. Mammographic images were processed with CAD.  On physical exam, no mass is palpated in the left upper inner quadrant.  Ultrasound is performed, showing an ill-defined hypoechoic mass with calcifications at 10 o'clock 9 cm from the left nipple measuring 0.7 x 0.6 x 0.7 cm.  No abnormal nodes are noted in the left axilla.  The appearance is suspicious and biopsy is suggested. Ultrasound-guided core needle biopsy was discussed with the patient and she agreed with this plan.  IMPRESSION: Hypoechoic mass containing microcalcifications at 10 o'clock 9 cm from the left nipple.  RECOMMENDATION: Biopsy is suggested.  Ultrasound-guided core needle biopsy has been  scheduled for 12/13/2011.  I have discussed the findings and recommendations with the patient. Results were also provided in writing at the conclusion of the visit.  BI-RADS CATEGORY 4:  Suspicious abnormality - biopsy should be considered.  Addended by:  Cain Saupe, M.D. on 12/03/2011 10:06:10.  **END ADDENDUM** SIGNED BY: Cain Saupe, M.D.   12-25-11  *RADIOLOGY REPORT*  Clinical Data:  Ultrasound-guided core needle biopsy of a mass at 8 o'clock 5 cm from the right nipple with clip placement.  DIGITAL DIAGNOSTIC RIGHT MAMMOGRAM  Comparison:  Previous exams.  Findings:  Films are performed following ultrasound guided biopsy of a mass at 8 o'clock 5 cm from the right nipple.  The ribbon clip is appropriately positioned.  IMPRESSION: Appropriate clip placement following ultrasound-guided core needle biopsy of a mass at 8 o'clock 5 cm from the right nipple.   Original Report Authenticated By: Cain Saupe, M.D.    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  BILATERAL BREAST MRI WITH AND WITHOUT CONTRAST  Technique: Multiplanar, multisequence MR images of both breasts were obtained prior to and following the intravenous administration of 14ml of Multihance.  Three dimensional images were evaluated at the independent DynaCad workstation.  Comparison:  Right mammogram 12/17/2011, post-biopsy clip mammogram of the left breast 12/13/2011, diagnostic left mammogram left breast ultrasound of 12/03/2011, and ultrasound-guided core needle biopsy of the left breast 12/13/2011  Findings: T2-weighted images show scattered cysts bilaterally. There is a moderate background parenchymal enhancement pattern bilaterally with multiple enhancing foci scattered throughout both breasts.  In the deep upper inner left breast, 10  o'clock position, is an oval 6 mm greatest diameter T2 bright mass. There is mild clumped enhancement in this area on postcontrast images with continuous type kinetics.  The enhancement measures approximately 7 x 7 x 6 mm and corresponds to the biopsy-proven malignancy.  Biopsy clip artifact is vaguely seen within the area of enhancement.  It is noted that the biopsy clip is best visualized on mammogram.  The enhancement associated with the malignancy is not significantly greater than the patient's background parenchymal enhancement pattern.  Directly posterior to the biopsy-proven malignancy is a very focal area of T2 bright signal within the left pectoralis muscle.  On post-contrast images, there is associated focal enhancement in this region of T2 abnormality in the pectoralis muscle. The area of pectoralis enhancement measures 7 x 5 x 5 mm.  Bilateral axillary lymph nodes appears symmetric.  No enlarged or otherwise suspicious lymph node is identified.  No internal mammary chain lymphadenopathy.  IMPRESSION:  1.  Vague area of stippled enhancement and increased T2 signal in the deep upper inner left breast measures approximately 7 mm greatest diameter and corresponds to the biopsy-proven malignancy. Directly posterior to the biopsied mass is focal enhancement and T2 signal abnormality in the left pectoralis muscle, for which pectoralis muscle involvement by malignancy cannot be excluded. 2.  Complex parenchymal enhancement pattern bilaterally.  This can limit the sensitivity for detecting malignancy by MRI.  No specific evidence of malignancy is identified in the right breast. 3.  No evidence of lymphadenopathy.  RECOMMENDATION: It ends treatment planning for known left breast carcinoma  THREE-DIMENSIONAL MR IMAGE RENDERING ON INDEPENDENT WORKSTATION:  Three-dimensional MR images were rendered by post-processing of the original MR data on an independent workstation.  The three- dimensional MR images were  interpreted, and findings were reported in the accompanying complete MRI report for this study.  BI-RADS CATEGORY 6:  Known biopsy-proven malignancy - appropriate action should be taken.   Original Report Authenticated By: Britta Mccreedy, M.D.    [place] Core Biopsy  12/14/2011  **ADDENDUM** CREATED: 12/14/2011 11:34:21  Pathologic results have become available and indicate in-situ and invasive ductal carcinoma, which is concordant with the imaging appearance.  I spoke with the patient by telephone at 11:30 today, and discussed these results.  As we discussed, we will get her an appointment for breast mri at [facility], and an appointment with the [facility], and will contact her with these dates and times.  She denied any significant bruising or tenderness today.  **END ADDENDUM** SIGNED BY: Esperanza Heir, M.D.   12/13/2011  *RADIOLOGY REPORT*  Clinical Data:  7 mm hypoechoic mass with calcifications at 10 o'clock 9 cm from the left nipple.  ULTRASOUND GUIDED VACUUM ASSISTED CORE BIOPSY OF  THE LEFT BREAST  The patient and I discussed the procedure of ultrasound-guided biopsy, including benefits and alternatives.  We discussed the high likelihood of a successful procedure. We discussed the risks of the procedure including infection, bleeding, tissue injury, clip migration, and inadequate sampling.  Informed written consent was given.  Using sterile technique, 2% lidocaine, ultrasound guidance, and a 12 gauge vacuum assisted needle, biopsy was performed of the mass with calcifications at 10 o'clock 9 cm from the left nipple using a lateromedial approach.  At the conclusion of the procedure, a Hydromark coil tissue marker clip was deployed into the biopsy cavity.  Follow-up 2-view mammogram was performed and dictated separately.  IMPRESSION: Ultrasound-guided biopsy of a mass at 10 o'clock 9 cm from the left nipple.  No apparent complications.   Original Report Authenticated By: Cain Saupe, M.D.    Mm Digital  Diagnostic Unilat L  12/13/2011  *RADIOLOGY REPORT*  Clinical Data:  Ultrasound-guided core needle biopsy of a 7 mm mass with microcalcifications at 10 o'clock 9 cm from the left nipple with clip placement.  DIGITAL DIAGNOSTIC LEFT MAMMOGRAM  Comparison:  Previous exams.  Findings:  Films are performed following ultrasound guided biopsy of a 7 mm mass with microcalcifications at 10 o'clock, 9 cm from the left nipple.  The [facility] coil clip is appropriately positioned.  IMPRESSION: Appropriate clip placement following ultrasound-guided core needle biopsy of a mass with microcalcifications at 10 o'clock 9 cm from the left nipple.   Original Report Authenticated By: Cain Saupe, M.D.    Mm Digital Diagnostic Unilat L  12/03/2011  **ADDENDUM** CREATED: 12/03/2011 10:06:10  THE INITIAL REPORT ON THIS PATIENT IS INCORRECT.  THE CORRECT REPORT IS AS FOLLOWS:  *RADIOLOGY REPORT*  Clinical Data:  The patient returns for short interval follow-up of calcifications in the left upper inner quadrant posteriorly.  DIGITAL DIAGNOSTIC LEFT MAMMOGRAM WITH CAD AND LEFT BREAST ULTRASOUND:  Comparison:  06/20/2011, 06/29/2011  Findings:  The breast tissue is heterogeneously dense. Calcifications in the left upper inner quadrant posteriorly very slightly in size and shape today.  They may be associated with a small mass. Mammographic images were processed with CAD.  On physical exam, no mass is palpated in the left upper inner quadrant.  Ultrasound is performed, showing an ill-defined hypoechoic mass with calcifications at 10 o'clock 9 cm from the left nipple measuring 0.7 x 0.6 x 0.7 cm.  No abnormal nodes are noted in the left axilla.  The appearance is suspicious and biopsy is suggested. Ultrasound-guided core needle biopsy was discussed with the patient and she agreed with this plan.  IMPRESSION: Hypoechoic mass containing microcalcifications at 10 o'clock 9 cm from the left nipple.  RECOMMENDATION: Biopsy is suggested.   Ultrasound-guided core needle biopsy has been scheduled for 12/13/2011.  I have discussed the findings and recommendations with the patient. Results were also provided in writing at the conclusion of the visit.  BI-RADS CATEGORY 4:  Suspicious abnormality - biopsy should be considered.  Addended by:  Cain Saupe, M.D. on 12/03/2011 10:06:10.  **END ADDENDUM** SIGNED BY: Cain Saupe, M.D.   12/03/2011  *RADIOLOGY REPORT*  Clinical Data:  Ultrasound-guided core needle biopsy of a mass at 8 o'clock 5 cm from the right nipple with clip placement.  DIGITAL DIAGNOSTIC RIGHT MAMMOGRAM  Comparison:  Previous exams.  Findings:  Films are performed following ultrasound guided biopsy of a mass at 8 o'clock 5 cm from the right nipple.  The ribbon clip is appropriately positioned.  IMPRESSION: Appropriate clip placement following ultrasound-guided core needle biopsy of a mass at 8 o'clock 5 cm from the right nipple.   Original Report Authenticated By: Cain Saupe, M.D.    Mm Digital Diagnostic Unilat R  12/17/2011  *RADIOLOGY REPORT*  Clinical Data:  Recent diagnosis of invasive ductal carcinoma in the left upper inner quadrant.  Right mammogram is obtained for correlation with breast MRI.  DIGITAL DIAGNOSTIC RIGHT MAMMOGRAM WITH CAD  Comparison:  06/20/2011  Findings:  The breast tissue is heterogeneously dense.  There is no suspicious dominant mass, architectural distortion or calcification to suggest malignancy. Mammographic images were processed with CAD.  IMPRESSION: No mammographic evidence of malignancy in the right breast.  RECOMMENDATION: Yearly mammography is suggested.  I have discussed the findings and recommendations with the patient. Results were also provided in writing at the conclusion of the visit.  BI-RADS CATEGORY 1:  Negative.   Original Report Authenticated By: Cain Saupe, M.D.      LABS:    Chemistry      Component Value Date/Time   NA 139 12/19/2011 0822   K 4.1 12/19/2011  0822   CL 105 12/19/2011 0822   CO2 29 12/19/2011 0822   BUN 12.0 12/19/2011 0822   CREATININE 0.9 12/19/2011 0822      Component Value Date/Time   CALCIUM 9.7 12/19/2011 0822   ALKPHOS 50 12/19/2011 0822   AST 22 12/19/2011 0822   ALT 35 12/19/2011 0822   BILITOT 0.39 12/19/2011 0822      Lab Results  Component Value Date   WBC 3.7* 12/19/2011   HGB 14.2 12/19/2011   HCT 40.9 12/19/2011   MCV 90.1 12/19/2011   PLT 198 12/19/2011       PATHOLOGY: Low-grade ER/PR positive breast cancer  ASSESSMENT    Patient is a small tumor seen on imaging. This is amenable to lumpectomy with sentinel lymph node removal as well  Clinical Trial Eligibility: no Multidisciplinary conference discussion yes    PLAN:    Current plans for the patient undergo lumpectomy. The tumor sample be sent for Oncotype testing to determine if she needs chemotherapy or not. A genetics appointment has been made. I plan to see the patient after the Oncotype information has been obtained. In all likelihood she will not require chemotherapy. I did describe to her tamoxifen therapy, side effects and length of therapy. The patient has an excellent prognosis.       Discussion: Patient is being treated per NCCN breast cancer care guidelines appropriate for stage.1   Thank you so much for allowing me to participate in the care of [employer]. I will continue to follow up the patient with you and assist in her care.  All questions were answered. The patient knows to call the clinic with any problems, questions or concerns. We can certainly see the patient much sooner if necessary.  I spent 20 minutes counseling the patient face to face. The total time spent in the appointment was 40 minutes.  Pierce Crane M.D. FRCP C. 12/19/2011, 1:16 PM

## 2011-12-19 NOTE — Progress Notes (Signed)
Patient ID: Tina Parks, female   DOB: [birth year], 41 y.o.   MRN: [id number]  Chief Complaint  Patient presents with  . Breast Cancer    Left     HPI Tina Parks is a 41 y.o. female.  She had a mammogram in May which showed a possible abnormality in the left breast, upper inner quadrant. A followup mammogram recently done show more worrisome area and a needle core biopsy was done which shows a small grade 1 invasive ductal carcinoma, receptor positive, HER-2 negative, with a low Ki-67 of less than 5%. MRI has failed to show any other abnormality. The lesion is close to the chest wall. There is no evidence of lymphatic abnormality on mammogram, ultrasound, or MRI. The patient is asymptomatic. She has a grandmother who developed breast cancer at approximately age 64. There is no other family history of breast or ovarian cancer. HPI  Past Medical History  Diagnosis Date  . CIN I (cervical intraepithelial neoplasia I)   . Stress fracture 2011    Left Hip  . Depression   . Anxiety   . Sinusitis   . Bronchial spasm     Exercise induced  . Breast cancer     Past Surgical History  Procedure Date  . Colposcopy     Family History  Problem Relation Age of Onset  . Diabetes Paternal Uncle   . Hypertension Maternal Grandmother   . Heart disease Maternal Grandmother   . Breast cancer Maternal Grandmother     Age 41  . Dementia Maternal Grandmother   . Heart disease Paternal Grandmother   . Heart disease Paternal Grandfather     Social History History  Substance Use Topics  . Smoking status: Never Smoker   . Smokeless tobacco: Not on file  . Alcohol Use: 1.5 oz/week    3 drink(s) per week    No Known Allergies  Current Outpatient Prescriptions  Medication Sig Dispense Refill  . ergocalciferol (VITAMIN D2) 50000 UNITS capsule Take 50,000 Units by mouth every 14 (fourteen) days.      Marland Kitchen etonogestrel-ethinyl estradiol (NUVARING) 0.12-0.015 MG/24HR vaginal ring Change  monthly with no break  3 each  5  . montelukast (SINGULAIR) 10 MG tablet TAKE 1 TABLET BY MOUTH DAILY  90 tablet  3  . predniSONE (DELTASONE) 20 MG tablet 3 x3, 2x3, 1x3  18 tablet  0  . sertraline (ZOLOFT) 50 MG tablet Take 1 tablet (50 mg total) by mouth daily.  90 tablet  4    Review of Systems Review of Systems  Constitutional: Negative for fever, chills and unexpected weight change.  HENT: Positive for rhinorrhea. Negative for hearing loss, congestion, sore throat, trouble swallowing and voice change.   Eyes: Negative for visual disturbance.  Respiratory: Negative for cough and wheezing.   Cardiovascular: Negative for chest pain, palpitations and leg swelling.  Gastrointestinal: Negative for nausea, vomiting, abdominal pain, diarrhea, constipation, blood in stool, abdominal distention and anal bleeding.  Genitourinary: Negative for hematuria, vaginal bleeding and difficulty urinating.  Musculoskeletal: Negative for arthralgias.  Skin: Negative for rash and wound.  Neurological: Negative for seizures, syncope and headaches.  Hematological: Negative for adenopathy. Does not bruise/bleed easily.  Psychiatric/Behavioral: Negative for confusion.    Blood pressure 110/74, pulse 80, temperature 98.3 F (36.8 C), resp. rate 18, height 5' 6.5" (1.689 m), weight 156 lb (70.761 kg), last menstrual period 12/05/2011.  Physical Exam Physical Exam  Vitals reviewed. Constitutional: She is oriented to person, place,  and time. She appears well-developed and well-nourished. No distress.  HENT:  Head: Normocephalic and atraumatic.  Mouth/Throat: Oropharynx is clear and moist.  Eyes: Conjunctivae normal and EOM are normal. Pupils are equal, round, and reactive to light. No scleral icterus.  Neck: Normal range of motion. Neck supple. No tracheal deviation present. No thyromegaly present.  Cardiovascular: Normal rate, regular rhythm, normal heart sounds and intact distal pulses.  Exam reveals no  gallop and no friction rub.   No murmur heard. Pulmonary/Chest: Effort normal and breath sounds normal. No respiratory distress. She has no wheezes. She has no rales.         Is a small ecchymotic area on the left breast upper inner quadrant consistent with recent biopsy site. The breasts are symmetric in size and shape. There otherwise essentially normal although in the right breast at the 3:00 position adjacent to the areola is a question of a nodular density which may represent benign fibrocystic changes.  Abdominal: Soft. Bowel sounds are normal. She exhibits no distension and no mass. There is no tenderness. There is no rebound and no guarding.  Musculoskeletal: Normal range of motion. She exhibits no edema and no tenderness.  Lymphadenopathy:    She has no cervical adenopathy.    She has no axillary adenopathy.       Right: No supraclavicular adenopathy present.       Left: No supraclavicular adenopathy present.  Neurological: She is alert and oriented to person, place, and time.  Skin: Skin is warm and dry. No rash noted. She is not diaphoretic. No erythema.  Psychiatric: She has a normal mood and affect. Her behavior is normal. Judgment and thought content normal.    Data Reviewed I reviewed the mammogram and MRI films and reports and discussed with radiologist. I have reviewed the pathology report and slides with the pathologist.  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 We discussed the multidisciplinary approach to breast cancer which often includes both medical and radiation oncology consultations.  We also discussed surgical options for the treatment of breast cancer including lumpectomy and mastectomy with possible reconstructive surgery. In addition we talked about the evaluation  and management of lymph nodes including a description of sentinel lymph node biopsy and axillary dissections. We reviewed potential complications and risks including bleeding, infection, numbness,  lymphedema, and the potential need for additional surgery.  She understands that for patients who are candidate for lumpectomy or mastectomy there is an equal survival rate with either technique, but a slightly higher local recurrence rate with lumpectomy. In addition she knows that a lumpectomy usually requires postoperative radiation as part of the management of the breast cancer.  We have discussed the likely postoperative course and plans for followup.  I have given the patient some written information that reviewed all of these issues. I believe her questions are answered and that she has a good understanding of the issues. I have discussed the indications for the lumpectomy and described the procedure. She understand that the chance of removal of the abnormal area is very good, but that occasionally we are unable to locate it and may have to do a second procedure. We also discussed the possibility of a second procedure to get additional tissue. Risks of surgery such as bleeding and infection have  also been explained, as well as the implications of not doing the surgery. She understands and wishes to proceed.   I have reviewed all of this as noted above. She is excellent candidate for a wire localized lumpectomy and sentinel lymph node evaluation. She is also going to have genetic counseling so I think any surgery should be deferred until the results of the genetic counseling are available, probably about 3 weeks.      Shakima Nisley Parks 12/19/2011, 10:30 AM

## 2011-12-20 ENCOUNTER — Other Ambulatory Visit (INDEPENDENT_AMBULATORY_CARE_PROVIDER_SITE_OTHER): Payer: Self-pay | Admitting: Surgery

## 2011-12-20 DIAGNOSIS — C50519 Malignant neoplasm of lower-outer quadrant of unspecified female breast: Secondary | ICD-10-CM

## 2011-12-21 ENCOUNTER — Telehealth (INDEPENDENT_AMBULATORY_CARE_PROVIDER_SITE_OTHER): Payer: Self-pay | Admitting: General Surgery

## 2011-12-21 NOTE — Telephone Encounter (Signed)
Message copied by Littie Deeds on Fri Dec 21, 2011  9:04 AM ------      Message from: Currie Paris      Created: Fri Dec 21, 2011  8:21 AM      Regarding: RE: Surgery case       Am checking with oncology about why the delay. Would leave as is until we figure out the delay.              Let her we are checking on it with the cancer center      ----- Message -----         From: Littie Deeds         Sent: 12/20/2011   3:53 PM           To: Currie Paris, MD      Subject: FW: Surgery case                                         Her surgery as of now is scheduled for 12/12.  Do you want her to hold off on surgery until we receive her genetics back?      ----- Message -----         From: Mitzi Davenport         Sent: 12/20/2011   1:30 PM           To: Currie Paris, MD, Liliana Cline, CMA, #      Subject: Surgery case                                             Per patient she went for genetic testing yesterday, her results will not be back until 1.1.14.  Should she wait to schedule? Per pt depending on her results she may need a bil mastectomy vs just a lumpectomy.  Please advise            thank you      [place]

## 2011-12-21 NOTE — Telephone Encounter (Signed)
Spoke with pt and informed her that we are working on getting in touch with the oncologist and genetics to find out more information before her surgery, which is scheduled for 12/12, I informed her that we would call her as soon as we know something.  Told her that Dr. Jamey Ripa would like her to keep her current surgery date until we hear something, if we need to adjust after that then we will.

## 2011-12-21 NOTE — Telephone Encounter (Signed)
Message copied by Littie Deeds on Fri Dec 21, 2011  8:57 AM ------      Message from: Currie Paris      Created: Fri Dec 21, 2011  8:21 AM      Regarding: RE: Surgery case       Am checking with oncology about why the delay. Would leave as is until we figure out the delay.              Let her we are checking on it with the cancer center      ----- Message -----         From: Littie Deeds         Sent: 12/20/2011   3:53 PM           To: Currie Paris, MD      Subject: FW: Surgery case                                         Her surgery as of now is scheduled for 12/12.  Do you want her to hold off on surgery until we receive her genetics back?      ----- Message -----         From: Mitzi Davenport         Sent: 12/20/2011   1:30 PM           To: Currie Paris, MD, Liliana Cline, CMA, #      Subject: Surgery case                                             Per patient she went for genetic testing yesterday, her results will not be back until 1.1.14.  Should she wait to schedule? Per pt depending on her results she may need a bil mastectomy vs just a lumpectomy.  Please advise            thank you      [place]

## 2011-12-24 ENCOUNTER — Telehealth: Payer: Self-pay | Admitting: *Deleted

## 2011-12-24 NOTE — Telephone Encounter (Signed)
Spoke to pt concerning [facility] from 12/19/11.  Pt denies questions or concerns regarding dx or treatment care plan.  Pt relayed she has decided to wait until her full genetic panel return before deciding on surgery.  She is planning on surgery at the first of the year.  Encourage pt to call with needs.  Received verbal understanding.  Contact information given.

## 2011-12-26 ENCOUNTER — Encounter: Payer: Self-pay | Admitting: *Deleted

## 2011-12-26 NOTE — Progress Notes (Signed)
Mailed after appt letter to pt. 

## 2012-01-02 ENCOUNTER — Telehealth (INDEPENDENT_AMBULATORY_CARE_PROVIDER_SITE_OTHER): Payer: Self-pay | Admitting: General Surgery

## 2012-01-02 NOTE — Telephone Encounter (Signed)
Patient saw Dr Kelly Splinter yesterday and was under the impression that if she had a lumpectomy with radiation that her nipple would be removed and a lot of tissue would be removed and she would be very assymetrical. I explained that looking at Dr Tenna Child office note and the placement of her tumor it does not look like she would need her nipple removed with a lumpectomy. She is very confused about what she wants to do. She would like Dr Jamey Ripa to call her husband and speak with him because he understands things better and can think of the appropriate questions. I told her I would ask Dr Jamey Ripa to speak with him and I would give her time to make up her mind. She is aware we will get her on for surgery when she is ready. If she decides on mastectomy with reconstruction she would like to get scheduled as soon as possible.

## 2012-01-02 NOTE — Telephone Encounter (Signed)
Message copied by Liliana Cline on Wed Jan 02, 2012 11:11 AM ------      Message from: Isaias Sakai K      Created: Wed Jan 02, 2012  9:46 AM      Regarding: Dr Tomasita Crumble: [phone]       Has questions about lab results and would like to discuss sx.

## 2012-01-03 NOTE — Telephone Encounter (Signed)
Patient called back and has decided she wants to proceed with bilateral mastectomy with reconstruction to coordinate with Dr Kelly Splinter. Note sent to Dr Jamey Ripa to write orders. Patient will call with any questions.

## 2012-01-04 ENCOUNTER — Telehealth: Payer: Self-pay | Admitting: *Deleted

## 2012-01-04 ENCOUNTER — Other Ambulatory Visit (INDEPENDENT_AMBULATORY_CARE_PROVIDER_SITE_OTHER): Payer: Self-pay | Admitting: Surgery

## 2012-01-04 ENCOUNTER — Other Ambulatory Visit: Payer: Self-pay | Admitting: *Deleted

## 2012-01-04 DIAGNOSIS — C50912 Malignant neoplasm of unspecified site of left female breast: Secondary | ICD-10-CM

## 2012-01-04 DIAGNOSIS — C50919 Malignant neoplasm of unspecified site of unspecified female breast: Secondary | ICD-10-CM

## 2012-01-04 MED ORDER — TAMOXIFEN CITRATE 20 MG PO TABS: 20.0000 mg | ORAL_TABLET | Freq: Every day | ORAL | Status: DC

## 2012-01-04 NOTE — Telephone Encounter (Signed)
Per Dr. Donnie Coffin informed pt of his recommendation to start Tamoxifen d/t surgery will not be until the beginning of January.  Received verbal understanding.  Called script in to [facility] outpt pharmacy.

## 2012-01-08 ENCOUNTER — Other Ambulatory Visit (HOSPITAL_COMMUNITY): Payer: 59

## 2012-01-10 ENCOUNTER — Encounter (HOSPITAL_BASED_OUTPATIENT_CLINIC_OR_DEPARTMENT_OTHER): Payer: Self-pay

## 2012-01-10 ENCOUNTER — Ambulatory Visit (HOSPITAL_BASED_OUTPATIENT_CLINIC_OR_DEPARTMENT_OTHER): Admit: 2012-01-10 | Payer: Self-pay | Admitting: Surgery

## 2012-01-10 ENCOUNTER — Inpatient Hospital Stay (HOSPITAL_COMMUNITY): Admission: RE | Admit: 2012-01-10 | Payer: 59 | Source: Ambulatory Visit

## 2012-01-10 SURGERY — BREAST LUMPECTOMY WITH NEEDLE LOCALIZATION AND AXILLARY SENTINEL LYMPH NODE BX
Anesthesia: General | Laterality: Left

## 2012-01-15 ENCOUNTER — Telehealth: Payer: Self-pay | Admitting: *Deleted

## 2012-01-15 NOTE — Telephone Encounter (Signed)
Spoke to pt concerning f/u after surgery.  Informed pt that Dr. Donnie Coffin will be leaving practice and that Dr. Darnelle Catalan or Dr. Welton Flakes will continue her f/u care.  Pt request to see Dr. Welton Flakes.  Informed pt that we will call her with f/u appt with Dr. Welton Flakes for after surgery.  Encourage pt to call with questions.  Received verbal understanding.  Contact information given.

## 2012-01-19 ENCOUNTER — Telehealth: Payer: Self-pay | Admitting: *Deleted

## 2012-01-19 NOTE — Telephone Encounter (Signed)
Patient returned my call and I informed her appt Dr. Donnie Coffin no longer going to be here w/ the practice as of 01/30/12 and answered all questions at this time.  Gave pt appt and she was unable to come in on Thursday, so I rescheduled and confirmed 03/14/12 appt w/ pt.

## 2012-01-19 NOTE — Telephone Encounter (Signed)
Left message for pt to return my call on Monday about her upcoming appt. 

## 2012-01-22 ENCOUNTER — Other Ambulatory Visit: Payer: Self-pay | Admitting: *Deleted

## 2012-01-22 DIAGNOSIS — C50519 Malignant neoplasm of lower-outer quadrant of unspecified female breast: Secondary | ICD-10-CM

## 2012-01-29 ENCOUNTER — Other Ambulatory Visit: Payer: Self-pay | Admitting: Emergency Medicine

## 2012-01-29 DIAGNOSIS — G4700 Insomnia, unspecified: Secondary | ICD-10-CM

## 2012-01-29 MED ORDER — ALPRAZOLAM 1 MG PO TABS: ORAL_TABLET | ORAL | Status: DC

## 2012-01-30 DIAGNOSIS — Z923 Personal history of irradiation: Secondary | ICD-10-CM

## 2012-01-30 HISTORY — DX: Personal history of irradiation: Z92.3

## 2012-01-30 HISTORY — PX: BREAST SURGERY: SHX581

## 2012-01-31 ENCOUNTER — Telehealth: Payer: Self-pay | Admitting: Genetic Counselor

## 2012-01-31 NOTE — Telephone Encounter (Signed)
Left message to CB for test results. 

## 2012-02-01 ENCOUNTER — Encounter: Payer: Self-pay | Admitting: Genetic Counselor

## 2012-02-02 ENCOUNTER — Encounter: Payer: Self-pay | Admitting: Oncology

## 2012-02-07 ENCOUNTER — Encounter (HOSPITAL_COMMUNITY): Payer: Self-pay | Admitting: Pharmacy Technician

## 2012-02-08 ENCOUNTER — Encounter (INDEPENDENT_AMBULATORY_CARE_PROVIDER_SITE_OTHER): Payer: Self-pay | Admitting: Surgery

## 2012-02-08 ENCOUNTER — Ambulatory Visit (INDEPENDENT_AMBULATORY_CARE_PROVIDER_SITE_OTHER): Payer: Commercial Managed Care - PPO | Admitting: Surgery

## 2012-02-08 VITALS — BP 110/58 | HR 76 | Temp 97.4°F | Resp 20 | Ht 66.0 in | Wt 153.0 lb

## 2012-02-08 DIAGNOSIS — C50912 Malignant neoplasm of unspecified site of left female breast: Secondary | ICD-10-CM

## 2012-02-08 DIAGNOSIS — C50919 Malignant neoplasm of unspecified site of unspecified female breast: Secondary | ICD-10-CM

## 2012-02-08 NOTE — Progress Notes (Signed)
Chief complaint: Left breast cancer History of present illness: I suspect a month ago with a newly diagnosed left breast cancer. She had initially requested bilateral mastectomies with reconstructions. She is presently scheduled for January 23 for surgery. After further reflection, she is decided to have a lumpectomy rather than a mastectomy. She came in to talk about that today. She's had no other interval problems.  Exam: Vital signs:BP 110/58  Pulse 76  Temp 97.4 F (36.3 C) (Temporal)  Resp 20  Ht 5' 6" (1.676 m)  Wt 153 lb (69.4 kg)  BMI 24.69 kg/m2 Gen.: Patient alert oriented healthy-appearing Lungs: Clear to auscultation Heart: Regular rhythm, no murmurs rubs or gallops Breasts: Symmetric. There is not a palpable mass on either side.  Data reviewed I looked over our original notes. She also had BRCA1 and 2 testing and does have some abnormality of uncertain significance.  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 She understand that the chance of removal of the abnormal area is very good, but that occasionally we are unable to locate it and may have to do a second procedure. We also discussed the possibility of a second procedure to get additional tissue. Risks of surgery such as bleeding and infection have also been explained, as well as the implications of not doing the surgery. She understands and wishes to proceed.  

## 2012-02-08 NOTE — Patient Instructions (Signed)
We will change the planned surgery to a wire localized lumpectomy and sentinel lymph node evaluation. Please call me if you have any questions before the surgery. [phone]

## 2012-02-11 ENCOUNTER — Telehealth (INDEPENDENT_AMBULATORY_CARE_PROVIDER_SITE_OTHER): Payer: Self-pay | Admitting: General Surgery

## 2012-02-11 ENCOUNTER — Encounter: Payer: Self-pay | Admitting: Physician Assistant

## 2012-02-11 NOTE — Telephone Encounter (Signed)
Message copied by Liliana Cline on Mon Feb 11, 2012  2:48 PM ------      Message from: Marin Shutter      Created: Mon Feb 11, 2012  1:52 PM      Regarding: Dr.Streck      Contact: [phone]       Pt works at [employer]. She was 'miffed' that her chart had been faxed to [facility].  Since she works there, she was happy a friendly co-worker picked up the fax.  She is asking NOT to send faxes to her work - as they are on [employer] also.  Thx

## 2012-02-11 NOTE — Telephone Encounter (Signed)
Not sure who faxed anything there- it was not me- but will add message to patients chart so that anyone in the chart is aware.

## 2012-02-12 ENCOUNTER — Encounter (HOSPITAL_COMMUNITY): Admission: RE | Admit: 2012-02-12 | Payer: 59 | Source: Ambulatory Visit

## 2012-02-14 ENCOUNTER — Encounter: Payer: Self-pay | Admitting: Radiology

## 2012-02-14 NOTE — Progress Notes (Signed)
  Subjective:    Patient ID: Tina Parks, female    DOB: 12/16/1970, 42 y.o.   MRN: [id number]  HPI Looked in chart for forms/ did not find they were possibly sent to be scanned.   Review of Systems     Objective:   Physical Exam        Assessment & Plan:

## 2012-02-18 ENCOUNTER — Encounter (HOSPITAL_BASED_OUTPATIENT_CLINIC_OR_DEPARTMENT_OTHER): Payer: Self-pay | Admitting: *Deleted

## 2012-02-21 ENCOUNTER — Encounter (HOSPITAL_COMMUNITY)
Admission: RE | Disposition: A | Discharge status: Home / Self Care | Payer: Self-pay | Source: Ambulatory Visit | Attending: Surgery

## 2012-02-21 ENCOUNTER — Ambulatory Visit (HOSPITAL_COMMUNITY): Admission: RE | Admit: 2012-02-21 | Payer: 59 | Source: Ambulatory Visit | Admitting: Surgery

## 2012-02-21 ENCOUNTER — Encounter (HOSPITAL_COMMUNITY): Payer: Self-pay | Admitting: Anesthesiology

## 2012-02-21 ENCOUNTER — Encounter (HOSPITAL_COMMUNITY): Admission: RE | Payer: Self-pay | Source: Ambulatory Visit

## 2012-02-21 ENCOUNTER — Inpatient Hospital Stay (HOSPITAL_BASED_OUTPATIENT_CLINIC_OR_DEPARTMENT_OTHER)
Admission: RE | Admit: 2012-02-21 | Discharge: 2012-02-22 | DRG: 579 | Disposition: A | Discharge status: Home / Self Care | Payer: 59 | Source: Ambulatory Visit | Attending: Surgery | Admitting: Surgery

## 2012-02-21 ENCOUNTER — Ambulatory Visit (HOSPITAL_BASED_OUTPATIENT_CLINIC_OR_DEPARTMENT_OTHER): Payer: 59 | Admitting: *Deleted

## 2012-02-21 ENCOUNTER — Ambulatory Visit
Admission: RE | Admit: 2012-02-21 | Discharge: 2012-02-21 | Disposition: A | Discharge status: Home / Self Care | Payer: 59 | Source: Ambulatory Visit | Attending: Surgery | Admitting: Surgery

## 2012-02-21 ENCOUNTER — Other Ambulatory Visit (INDEPENDENT_AMBULATORY_CARE_PROVIDER_SITE_OTHER): Payer: Self-pay | Admitting: Surgery

## 2012-02-21 ENCOUNTER — Encounter (HOSPITAL_COMMUNITY)
Admission: RE | Admit: 2012-02-21 | Discharge: 2012-02-21 | Disposition: A | Discharge status: Home / Self Care | Payer: 59 | Source: Ambulatory Visit | Attending: Surgery | Admitting: Surgery

## 2012-02-21 ENCOUNTER — Encounter (HOSPITAL_BASED_OUTPATIENT_CLINIC_OR_DEPARTMENT_OTHER): Payer: Self-pay | Admitting: *Deleted

## 2012-02-21 ENCOUNTER — Other Ambulatory Visit: Payer: Self-pay

## 2012-02-21 DIAGNOSIS — C50219 Malignant neoplasm of upper-inner quadrant of unspecified female breast: Principal | ICD-10-CM | POA: Diagnosis present

## 2012-02-21 DIAGNOSIS — Z853 Personal history of malignant neoplasm of breast: Secondary | ICD-10-CM | POA: Diagnosis present

## 2012-02-21 DIAGNOSIS — Z79899 Other long term (current) drug therapy: Secondary | ICD-10-CM

## 2012-02-21 DIAGNOSIS — Y9210 Unspecified residential institution as the place of occurrence of the external cause: Secondary | ICD-10-CM | POA: Diagnosis not present

## 2012-02-21 DIAGNOSIS — I519 Heart disease, unspecified: Secondary | ICD-10-CM | POA: Diagnosis present

## 2012-02-21 DIAGNOSIS — I498 Other specified cardiac arrhythmias: Secondary | ICD-10-CM | POA: Diagnosis not present

## 2012-02-21 DIAGNOSIS — N870 Mild cervical dysplasia: Secondary | ICD-10-CM | POA: Diagnosis present

## 2012-02-21 DIAGNOSIS — Z170 Estrogen receptor positive status [ER+]: Secondary | ICD-10-CM

## 2012-02-21 DIAGNOSIS — I469 Cardiac arrest, cause unspecified: Secondary | ICD-10-CM | POA: Diagnosis not present

## 2012-02-21 DIAGNOSIS — IMO0002 Reserved for concepts with insufficient information to code with codable children: Secondary | ICD-10-CM | POA: Diagnosis not present

## 2012-02-21 DIAGNOSIS — C50519 Malignant neoplasm of lower-outer quadrant of unspecified female breast: Secondary | ICD-10-CM

## 2012-02-21 DIAGNOSIS — Y836 Removal of other organ (partial) (total) as the cause of abnormal reaction of the patient, or of later complication, without mention of misadventure at the time of the procedure: Secondary | ICD-10-CM | POA: Diagnosis not present

## 2012-02-21 DIAGNOSIS — J329 Chronic sinusitis, unspecified: Secondary | ICD-10-CM | POA: Diagnosis present

## 2012-02-21 DIAGNOSIS — C50919 Malignant neoplasm of unspecified site of unspecified female breast: Secondary | ICD-10-CM

## 2012-02-21 DIAGNOSIS — C50912 Malignant neoplasm of unspecified site of left female breast: Secondary | ICD-10-CM

## 2012-02-21 DIAGNOSIS — F411 Generalized anxiety disorder: Secondary | ICD-10-CM | POA: Diagnosis present

## 2012-02-21 HISTORY — DX: Family history of other specified conditions: Z84.89

## 2012-02-21 HISTORY — PX: BREAST LUMPECTOMY: SHX2

## 2012-02-21 HISTORY — PX: BREAST LUMPECTOMY WITH NEEDLE LOCALIZATION AND AXILLARY SENTINEL LYMPH NODE BX: SHX5760

## 2012-02-21 HISTORY — PX: BREAST BIOPSY: SHX20

## 2012-02-21 LAB — CBC WITH DIFFERENTIAL/PLATELET
Basophils Absolute: 0 10*3/uL (ref 0.0–0.1)
Basophils Relative: 0 % (ref 0–1)
Eosinophils Absolute: 0 10*3/uL (ref 0.0–0.7)
Eosinophils Relative: 0 % (ref 0–5)
Lymphs Abs: 0.5 10*3/uL — ABNORMAL LOW (ref 0.7–4.0)
MCH: 30.8 pg (ref 26.0–34.0)
MCHC: 34.4 g/dL (ref 30.0–36.0)
MCV: 89.5 fL (ref 78.0–100.0)
Neutrophils Relative %: 92 % — ABNORMAL HIGH (ref 43–77)
Platelets: 176 10*3/uL (ref 150–400)
RBC: 4.38 MIL/uL (ref 3.87–5.11)
RDW: 12.4 % (ref 11.5–15.5)

## 2012-02-21 LAB — COMPREHENSIVE METABOLIC PANEL
Albumin: 3.3 g/dL — ABNORMAL LOW (ref 3.5–5.2)
Alkaline Phosphatase: 34 U/L — ABNORMAL LOW (ref 39–117)
BUN: 14 mg/dL (ref 6–23)
Chloride: 104 mEq/L (ref 96–112)
Creatinine, Ser: 0.71 mg/dL (ref 0.50–1.10)
GFR calc Af Amer: >90 mL/min (ref >90)
Glucose, Bld: 122 mg/dL — ABNORMAL HIGH (ref 70–99)
Total Bilirubin: 0.2 mg/dL — ABNORMAL LOW (ref 0.3–1.2)

## 2012-02-21 LAB — APTT: aPTT: 30 seconds (ref 24–37)

## 2012-02-21 LAB — PROTIME-INR: Prothrombin Time: 13.7 seconds (ref 11.6–15.2)

## 2012-02-21 LAB — TSH: TSH: 0.759 u[IU]/mL (ref 0.350–4.500)

## 2012-02-21 LAB — MRSA PCR SCREENING: MRSA by PCR: POSITIVE — AB

## 2012-02-21 LAB — MAGNESIUM: Magnesium: 1.8 mg/dL (ref 1.5–2.5)

## 2012-02-21 SURGERY — SIMPLE MASTECTOMY WITH AXILLARY SENTINEL NODE BIOPSY
Anesthesia: General | Site: Breast | Laterality: Right

## 2012-02-21 SURGERY — BREAST LUMPECTOMY WITH NEEDLE LOCALIZATION AND AXILLARY SENTINEL LYMPH NODE BX
Anesthesia: General | Site: Breast | Laterality: Left | Wound class: Contaminated

## 2012-02-21 MED ORDER — FENTANYL CITRATE 0.05 MG/ML IJ SOLN
INTRAMUSCULAR | Status: DC | PRN
  Administered 2012-02-21: 50 ug via INTRAVENOUS
  Administered 2012-02-21: 25 ug via INTRAVENOUS

## 2012-02-21 MED ORDER — ONDANSETRON HCL 4 MG/2ML IJ SOLN
4.0000 mg | Freq: Four times a day (QID) | INTRAMUSCULAR | Status: DC | PRN
  Administered 2012-02-22: 4 mg via INTRAVENOUS
  Filled 2012-02-21: qty 2

## 2012-02-21 MED ORDER — ALPRAZOLAM 0.5 MG PO TABS
1.0000 mg | ORAL_TABLET | Freq: Every evening | ORAL | Status: DC | PRN
  Administered 2012-02-22: 1 mg via ORAL
  Filled 2012-02-21 (×2): qty 1

## 2012-02-21 MED ORDER — OXYCODONE HCL 5 MG PO TABS
5.0000 mg | ORAL_TABLET | Freq: Once | ORAL | Status: DC | PRN
Start: 2012-02-21 — End: 2012-02-21

## 2012-02-21 MED ORDER — CHLORHEXIDINE GLUCONATE 4 % EX LIQD: 1.0000 "application " | Freq: Once | CUTANEOUS | Status: DC

## 2012-02-21 MED ORDER — PROPOFOL 10 MG/ML IV BOLUS
INTRAVENOUS | Status: DC | PRN
  Administered 2012-02-21: 200 mg via INTRAVENOUS

## 2012-02-21 MED ORDER — SODIUM CHLORIDE 0.9 % IJ SOLN
INTRAMUSCULAR | Status: DC | PRN
  Administered 2012-02-21: 11:00:00

## 2012-02-21 MED ORDER — OXYCODONE-ACETAMINOPHEN 5-325 MG PO TABS: 1.0000 | ORAL_TABLET | ORAL | Status: DC | PRN

## 2012-02-21 MED ORDER — CEFAZOLIN SODIUM-DEXTROSE 2-3 GM-% IV SOLR
2.0000 g | INTRAVENOUS | Status: AC
  Administered 2012-02-21: 2 g via INTRAVENOUS

## 2012-02-21 MED ORDER — OXYCODONE-ACETAMINOPHEN 5-325 MG PO TABS
1.0000 | ORAL_TABLET | ORAL | Status: DC | PRN
  Administered 2012-02-22 (×2): 1 via ORAL
  Filled 2012-02-21: qty 1
  Filled 2012-02-21: qty 2

## 2012-02-21 MED ORDER — EPINEPHRINE HCL 0.1 MG/ML IJ SOLN
INTRAMUSCULAR | Status: DC | PRN
  Administered 2012-02-21: 0.3 mg via INTRAVENOUS

## 2012-02-21 MED ORDER — BUPIVACAINE HCL (PF) 0.25 % IJ SOLN
INTRAMUSCULAR | Status: DC | PRN
  Administered 2012-02-21: 20 mL

## 2012-02-21 MED ORDER — EPHEDRINE SULFATE 50 MG/ML IJ SOLN
INTRAMUSCULAR | Status: DC | PRN
  Administered 2012-02-21: 10 mg via INTRAVENOUS

## 2012-02-21 MED ORDER — ACETAMINOPHEN 10 MG/ML IV SOLN
1000.0000 mg | Freq: Once | INTRAVENOUS | Status: AC
  Administered 2012-02-21: 1000 mg via INTRAVENOUS

## 2012-02-21 MED ORDER — MUPIROCIN 2 % EX OINT
1.0000 "application " | TOPICAL_OINTMENT | Freq: Two times a day (BID) | CUTANEOUS | Status: DC
  Administered 2012-02-21 – 2012-02-22 (×2): 1 via NASAL
  Filled 2012-02-21: qty 22

## 2012-02-21 MED ORDER — CEFAZOLIN SODIUM 1-5 GM-% IV SOLN
1.0000 g | Freq: Three times a day (TID) | INTRAVENOUS | Status: DC
  Administered 2012-02-21 – 2012-02-22 (×3): 1 g via INTRAVENOUS
  Filled 2012-02-21 (×5): qty 50

## 2012-02-21 MED ORDER — ONDANSETRON 8 MG PO TBDP: 8.0000 mg | ORAL_TABLET | Freq: Three times a day (TID) | ORAL | Status: DC | PRN

## 2012-02-21 MED ORDER — TECHNETIUM TC 99M SULFUR COLLOID FILTERED
1.0000 | Freq: Once | INTRAVENOUS | Status: AC | PRN
  Administered 2012-02-21: 1 via INTRADERMAL

## 2012-02-21 MED ORDER — HYDROMORPHONE HCL PF 1 MG/ML IJ SOLN: 0.2500 mg | INTRAMUSCULAR | Status: DC | PRN

## 2012-02-21 MED ORDER — IBUPROFEN 400 MG PO TABS
400.0000 mg | ORAL_TABLET | ORAL | Status: DC | PRN
  Administered 2012-02-21 (×2): 400 mg via ORAL
  Filled 2012-02-21 (×3): qty 1

## 2012-02-21 MED ORDER — ATROPINE SULFATE 0.4 MG/ML IJ SOLN
INTRAMUSCULAR | Status: DC | PRN
  Administered 2012-02-21 (×2): 0.4 mg via INTRAVENOUS

## 2012-02-21 MED ORDER — MONTELUKAST SODIUM 10 MG PO TABS
10.0000 mg | ORAL_TABLET | Freq: Every day | ORAL | Status: DC
  Administered 2012-02-21: 10 mg via ORAL
  Filled 2012-02-21 (×2): qty 1

## 2012-02-21 MED ORDER — ONDANSETRON HCL 4 MG PO TABS: 4.0000 mg | ORAL_TABLET | Freq: Four times a day (QID) | ORAL | Status: DC | PRN

## 2012-02-21 MED ORDER — CEFAZOLIN SODIUM-DEXTROSE 2-3 GM-% IV SOLR: 2.0000 g | INTRAVENOUS | Status: DC

## 2012-02-21 MED ORDER — CHLORHEXIDINE GLUCONATE CLOTH 2 % EX PADS
6.0000 | MEDICATED_PAD | Freq: Every day | CUTANEOUS | Status: DC
  Administered 2012-02-22: 6 via TOPICAL

## 2012-02-21 MED ORDER — MIDAZOLAM HCL 2 MG/2ML IJ SOLN
1.0000 mg | INTRAMUSCULAR | Status: DC | PRN
  Administered 2012-02-21: 2 mg via INTRAVENOUS

## 2012-02-21 MED ORDER — LACTATED RINGERS IV SOLN
INTRAVENOUS | Status: DC
  Administered 2012-02-21 (×3): via INTRAVENOUS

## 2012-02-21 MED ORDER — SCOPOLAMINE 1 MG/3DAYS TD PT72
1.0000 | MEDICATED_PATCH | TRANSDERMAL | Status: DC
  Administered 2012-02-21: 1.5 mg via TRANSDERMAL

## 2012-02-21 MED ORDER — ONDANSETRON HCL 4 MG/2ML IJ SOLN: 4.0000 mg | Freq: Once | INTRAMUSCULAR | Status: DC | PRN

## 2012-02-21 MED ORDER — ONDANSETRON HCL 4 MG/2ML IJ SOLN
INTRAMUSCULAR | Status: DC | PRN
  Administered 2012-02-21: 4 mg via INTRAVENOUS

## 2012-02-21 MED ORDER — LACTATED RINGERS IV SOLN
INTRAVENOUS | Status: DC | PRN
  Administered 2012-02-21: 12:00:00 via INTRAVENOUS

## 2012-02-21 MED ORDER — OXYCODONE HCL 5 MG/5ML PO SOLN: 5.0000 mg | Freq: Once | ORAL | Status: DC | PRN

## 2012-02-21 MED ORDER — KCL IN DEXTROSE-NACL 20-5-0.45 MEQ/L-%-% IV SOLN
INTRAVENOUS | Status: DC
  Administered 2012-02-21: 16:00:00 via INTRAVENOUS
  Filled 2012-02-21 (×4): qty 1000

## 2012-02-21 MED ORDER — DEXAMETHASONE SODIUM PHOSPHATE 4 MG/ML IJ SOLN
INTRAMUSCULAR | Status: DC | PRN
  Administered 2012-02-21: 10 mg via INTRAVENOUS

## 2012-02-21 MED ORDER — LIDOCAINE HCL (CARDIAC) 20 MG/ML IV SOLN
INTRAVENOUS | Status: DC | PRN
  Administered 2012-02-21: 100 mg via INTRAVENOUS

## 2012-02-21 MED ORDER — FENTANYL CITRATE 0.05 MG/ML IJ SOLN
50.0000 ug | INTRAMUSCULAR | Status: DC | PRN
  Administered 2012-02-21: 100 ug via INTRAVENOUS

## 2012-02-21 SURGICAL SUPPLY — 64 items
APPLIER CLIP 11 MED OPEN (CLIP)
APPLIER CLIP 9.375 MED OPEN (MISCELLANEOUS)
APR CLP MED 11 20 MLT OPN (CLIP)
BLADE HEX COATED 2.75 (ELECTRODE) ×2 IMPLANT
BLADE SURG 15 STRL LF DISP TIS (BLADE) ×2 IMPLANT
BLADE SURG 15 STRL SS (BLADE) ×2
CANISTER SUCTION 1200CC (MISCELLANEOUS) ×2 IMPLANT
CHLORAPREP W/TINT 26ML (MISCELLANEOUS) ×2 IMPLANT
CLIP APPLIE 11 MED OPEN (CLIP) IMPLANT
CLIP APPLIE 9.375 MED OPEN (MISCELLANEOUS) IMPLANT
CLIP TI MEDIUM 6 (CLIP) IMPLANT
CLIP TI WIDE RED SMALL 6 (CLIP) ×4 IMPLANT
CLOTH BEACON ORANGE TIMEOUT ST (SAFETY) ×2 IMPLANT
COVER MAYO STAND STRL (DRAPES) ×2 IMPLANT
COVER PROBE 5X48 (MISCELLANEOUS)
COVER PROBE W GEL 5X96 (DRAPES) ×4 IMPLANT
COVER TABLE BACK 60X90 (DRAPES) ×2 IMPLANT
DECANTER SPIKE VIAL GLASS SM (MISCELLANEOUS) IMPLANT
DERMABOND ADVANCED (GAUZE/BANDAGES/DRESSINGS) ×2
DERMABOND ADVANCED .7 DNX12 (GAUZE/BANDAGES/DRESSINGS) ×2 IMPLANT
DEVICE DUBIN W/COMP PLATE 8390 (MISCELLANEOUS) ×2 IMPLANT
DRAIN CHANNEL 19F RND (DRAIN) IMPLANT
DRAPE LAPAROSCOPIC ABDOMINAL (DRAPES) ×2 IMPLANT
DRAPE LAPAROTOMY TRNSV 102X78 (DRAPE) ×2 IMPLANT
DRAPE SURG 17X23 STRL (DRAPES) IMPLANT
DRAPE UTILITY XL STRL (DRAPES) ×2 IMPLANT
DRSG EMULSION OIL 3X3 NADH (GAUZE/BANDAGES/DRESSINGS) IMPLANT
ELECT BLADE 4.0 EZ CLEAN MEGAD (MISCELLANEOUS)
ELECT REM PT RETURN 9FT ADLT (ELECTROSURGICAL) ×2
ELECTRODE BLDE 4.0 EZ CLN MEGD (MISCELLANEOUS) IMPLANT
ELECTRODE REM PT RTRN 9FT ADLT (ELECTROSURGICAL) ×1 IMPLANT
EVACUATOR SILICONE 100CC (DRAIN) IMPLANT
GLOVE BIOGEL PI IND STRL 7.0 (GLOVE) ×1 IMPLANT
GLOVE BIOGEL PI INDICATOR 7.0 (GLOVE) ×1
GLOVE ECLIPSE 6.5 STRL STRAW (GLOVE) ×4 IMPLANT
GLOVE EUDERMIC 7 POWDERFREE (GLOVE) ×4 IMPLANT
GOWN PREVENTION PLUS XLARGE (GOWN DISPOSABLE) ×8 IMPLANT
KIT CVR 48X5XPRB PLUP LF (MISCELLANEOUS) IMPLANT
KIT MARKER MARGIN INK (KITS) ×2 IMPLANT
NDL SAFETY ECLIPSE 18X1.5 (NEEDLE) ×1 IMPLANT
NEEDLE HYPO 18GX1.5 SHARP (NEEDLE) ×1
NEEDLE HYPO 25X1 1.5 SAFETY (NEEDLE) ×4 IMPLANT
NS IRRIG 1000ML POUR BTL (IV SOLUTION) ×2 IMPLANT
PACK BASIN DAY SURGERY FS (CUSTOM PROCEDURE TRAY) ×2 IMPLANT
PENCIL BUTTON HOLSTER BLD 10FT (ELECTRODE) ×2 IMPLANT
PIN SAFETY STERILE (MISCELLANEOUS) IMPLANT
SHEET MEDIUM DRAPE 40X70 STRL (DRAPES) IMPLANT
SLEEVE SCD COMPRESS KNEE MED (MISCELLANEOUS) ×2 IMPLANT
SPONGE GAUZE 4X4 12PLY (GAUZE/BANDAGES/DRESSINGS) IMPLANT
SPONGE INTESTINAL PEANUT (DISPOSABLE) IMPLANT
SPONGE LAP 18X18 X RAY DECT (DISPOSABLE) IMPLANT
SPONGE LAP 4X18 X RAY DECT (DISPOSABLE) ×4 IMPLANT
SUT ETHILON 2 0 FS 18 (SUTURE) IMPLANT
SUT ETHILON 3 0 FSL (SUTURE) IMPLANT
SUT MNCRL AB 4-0 PS2 18 (SUTURE) ×6 IMPLANT
SUT VIC AB 4-0 BRD 54 (SUTURE) IMPLANT
SUT VICRYL 3-0 CR8 SH (SUTURE) ×4 IMPLANT
SYR CONTROL 10ML LL (SYRINGE) ×4 IMPLANT
TOWEL OR 17X24 6PK STRL BLUE (TOWEL DISPOSABLE) ×2 IMPLANT
TOWEL OR NON WOVEN STRL DISP B (DISPOSABLE) ×2 IMPLANT
TRAY DSU PREP LF (CUSTOM PROCEDURE TRAY) ×2 IMPLANT
TUBE CONNECTING 20X1/4 (TUBING) ×2 IMPLANT
WATER STERILE IRR 1000ML POUR (IV SOLUTION) IMPLANT
YANKAUER SUCT BULB TIP NO VENT (SUCTIONS) ×2 IMPLANT

## 2012-02-21 NOTE — Consult Note (Signed)
CARDIOLOGY CONSULT NOTE   Patient ID: Tina Parks MRN: [id number] DOB/AGE: 07-15-1970 42 y.o.  Admit date: 02/21/2012  Primary Physician   [facility] [place] Primary Cardiologist   none Reason for Consultation   Asystolic arrest while under general anesthesia  Tina Parks is a 42 y.o. female with no history of CAD. She was undergoing a lumpectomy today and developed bradycardia with a heart rate in the 30s. This rapidly progressed to asystole and she lost pulses. CPR was instituted and 1 amp Epi was given. She responded and went into sinus tachycardia. CPR was only performed for about 30 seconds.   Currently she is only experiencing pain in her left breast region. She denies chest pain, nausea, palpitations, and SOB. Denies any prior cardiac history of HTN, MI, or HL. Family history of heart disease in paternal grandparents. Denies prior reactions to dye or general or local anesthesias. Has had epidural with delivery, sedation for wisdom tooth extraction, and a CT with contrast. Family history of N/V with anesthesia. Personal history of N/V with Percocet. No recent med changes, no family history of sudden death.   Past Medical History  Diagnosis Date  . CIN I (cervical intraepithelial neoplasia I)   . Stress fracture 2011    Left Hip  . Depression   . Anxiety   . Sinusitis   . Bronchial spasm     Exercise induced  . Breast cancer   . Family history of anesthesia complication     significant PONV     Past Surgical History  Procedure Date  . Colposcopy   . Wisdom tooth extraction 1987    Sedation only    No Known Allergies  I have reviewed the patient's current medications    . montelukast  10 mg Oral QHS      . dextrose 5 % and 0.45 % NaCl with KCl 20 mEq/L     ibuprofen, ondansetron (ZOFRAN) IV, ondansetron, oxyCODONE-acetaminophen  Prior to Admission medications   Medication Sig Start Date End Date Taking? Authorizing Provider  ALPRAZolam Prudy Feeler) 1  MG tablet Take 1 mg by mouth at bedtime as needed. sleep   Yes Historical Provider, MD  montelukast (SINGULAIR) 10 MG tablet Take 10 mg by mouth daily.    Yes Historical Provider, MD  sertraline (ZOLOFT) 50 MG tablet Take 1 tablet (50 mg total) by mouth daily. 05/30/11  Yes Trellis Paganini, MD  tamoxifen (NOLVADEX) 20 MG tablet Take 1 tablet (20 mg total) by mouth daily. 01/04/12  Yes Pierce Crane, MD  ondansetron (ZOFRAN ODT) 8 MG disintegrating tablet Take 1 tablet (8 mg total) by mouth every 8 (eight) hours as needed for nausea. 02/21/12   Currie Paris, MD  oxyCODONE-acetaminophen (ROXICET) 5-325 MG per tablet Take 1 tablet by mouth every 4 (four) hours as needed for pain. 02/21/12   Currie Paris, MD     History   Social History  . Marital Status: Married    Spouse Name: N/A    Number of Children: N/A  . Years of Education: N/A   Occupational History  . 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   Social History Main Topics  . Smoking status: Never Smoker   . Smokeless tobacco: Never Used  . Alcohol Use: 1.5 oz/week    3 drink(s) per week     Comment: 0-4 glasses of wine per week  . Drug Use: No  . Sexually Active: Yes    Birth Control/ Protection: Inserts  Comment: Vasectomy  NUVARING   Social History Narrative  . 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    Family History  Problem Relation Age of Onset  . Diabetes Paternal Uncle   . Hypertension Maternal Grandmother   . Heart disease Maternal Grandmother   . Breast cancer Maternal Grandmother 73  . Dementia Maternal Grandmother   . Heart disease Paternal Grandmother   . Heart disease Paternal Grandfather   . Ovarian cancer Other     dx in her 89s    ROS:  Full 14 point review of systems complete and found to be negative unless listed above.  Physical Exam: Blood pressure 114/69, pulse 93, temperature 97.7 F (36.5 C), temperature source Oral, resp. rate 18, height 5\' 6"  (1.676 m), weight 153 lb (69.4 kg),  last menstrual period 02/14/2012, SpO2 100.00%.  General: Well developed, well nourished, female in no acute distress Head: Eyes PERRLA, No xanthomas.   Normocephalic and atraumatic, oropharynx without edema or exudate. Dentition good. Lungs: Breathing unlabored, CTA bilaterally Heart: HRRR S1 S2, no rub/gallop. Pulses are 2+ in all 4 extremities.   Neck: No carotid bruits. No lymphadenopathy. No JVD. Abdomen: Bowel sounds present, abdomen soft and non-tender without masses or hernias noted. Msk:  No spine or cva tenderness. No weakness, no joint deformities or effusions. Extremities: No clubbing or cyanosis. No edema.  Neuro: Alert and oriented X 3. No focal deficits noted. Psych:  Good affect, responds appropriately Skin: No rashes or lesions noted. Dressing over op site.  Labs:  Lab Results  Component Value Date   WBC 3.7* 12/19/2011   HGB 10.5* 02/21/2012   HCT 40.9 12/19/2011   MCV 90.1 12/19/2011   PLT 198 12/19/2011    Basename 02/21/12 1250  INR 1.06     Lab 02/21/12 1250  NA 140  K 4.1  CL 104  CO2 26  BUN 14  CREATININE 0.71  CALCIUM 8.5  PROT 5.7*  BILITOT 0.2*  ALKPHOS 34*  ALT 16  AST 20  GLUCOSE 122*   No results found for this basename: mg    ECG: 21-Feb-2012 13:44:37 [facility] ROUTINE RECORD Normal sinus rhythm Prolonged QT Abnormal ECG 45mm/s 60mm/mV 100Hz  8.0.1 12SL 241 HD CID: 1 Referred by: STRECK Unconfirmed Vent. rate 94 BPM PR interval 158 ms QRS duration 92 ms QT/QTc 386/482 ms P-R-T axes 69 81 62   Radiology:  US Breast Bx W Loc Dev 1st Lesion Img Bx Spec US Guide 02/21/2012  *RADIOLOGY REPORT*  Clinical Data:  left upper inner quadrant carcinoma  NEEDLE LOCALIZATION USING ULTRASOUND GUIDANCE AND SPECIMEN RADIOGRAPH  Comparison: Previous exams.  Patient presents for needle localization prior to lumpectomy.  I met with the patient and we discussed the procedure of needle localization including benefits and  alternatives. We discussed the high likelihood of a successful procedure. We discussed the risks of the procedure, including infection, bleeding, tissue injury, and further surgery. Informed, written consent was given.  Using ultrasound guidance, sterile technique, 2% lidocaine and a 5cm modified Kopans needle, the mass and clip were localized using a radial oblique approach.  The films are marked for Dr. Jamey Ripa.  Specimen radiograph is performed at , and confirms marker clip present in the tissue sample.  The specimen is marked for pathology.  IMPRESSION: Needle localization left breast.  No apparent complications.   Original Report Authenticated By: Esperanza Heir, M.D.     ASSESSMENT AND PLAN:   The patient  was seen today by Dr Shirlee Latch, the patient evaluated and the data reviewed.   Cardiac arrest during or resulting from a procedure - Currently in NSR. Continuous monitoring of rhythm and cardiac activity. Check echo. Check TSH.   Active Problems:  Cancer of upper-inner quadrant of female breast - had lumpectomy with sentinel node biopsy. Treatment per oncology/surgery.  Signed: Caroline More, PA-S2 3:10 PM  Seen and agree, changes made. Rhonda Barrett 02/21/2012, 3:10 PM Co-Sign MD  Patient seen with PA, agree with the above note.  During lumpectomy surgery, patient became bradycardic then asystolic during lymph node dissection near the end of the procedure.  She required 30 seconds CPR + epinephrine with immediate ROSC.  She is doing fine now.  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  ECG shows mildly prolonged QTc but was just after surgery.  I suspect that this event was vasovagal.  I will get an echo and repeat an ECG in the am.  Monitor on telemetry overnight, home on 2-wk monitor tomorrow if everything looks normal.   [name] 02/21/2012 3:15  PM

## 2012-02-21 NOTE — H&P (View-Only) (Signed)
Chief complaint: Left breast cancer History of present illness: I suspect a month ago with a newly diagnosed left breast cancer. She had initially requested bilateral mastectomies with reconstructions. She is presently scheduled for January 23 for surgery. After further reflection, she is decided to have a lumpectomy rather than a mastectomy. She came in to talk about that today. She's had no other interval problems.  Exam: Vital signs:BP 110/58  Pulse 76  Temp 97.4 F (36.3 C) (Temporal)  Resp 20  Ht 5\' 6"  (1.676 m)  Wt 153 lb (69.4 kg)  BMI 24.69 kg/m2 Gen.: Patient alert oriented healthy-appearing Lungs: Clear to auscultation Heart: Regular rhythm, no murmurs rubs or gallops Breasts: Symmetric. There is not a palpable mass on either side.  Data reviewed I looked over our original notes. She also had BRCA1 and 2 testing and does have some abnormality of uncertain significance.  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 She understand that the chance of removal of the abnormal area is very good, but that occasionally we are unable to locate it and may have to do a second procedure. We also discussed the possibility of a second procedure to get additional tissue. Risks of surgery such as bleeding and infection have also been explained, as well as the implications of not doing the surgery. She understands and wishes to proceed.

## 2012-02-21 NOTE — Progress Notes (Signed)
Patient seen in earlier (around 1500) in unit and appeared to be doing well. Comfortable, minimal pain wound looked OK, VS stable. Cards eval under way.  She had potential wound contamination in OR during CPR, so will continue antibiotics overnight

## 2012-02-21 NOTE — Anesthesia Procedure Notes (Signed)
Procedure Name: LMA Insertion Date/Time: 02/21/2012 10:45 AM Performed by: Meyer Russel Pre-anesthesia Checklist: Patient identified, Emergency Drugs available, Suction available and Patient being monitored Patient Re-evaluated:Patient Re-evaluated prior to inductionOxygen Delivery Method: Circle System Utilized Preoxygenation: Pre-oxygenation with 100% oxygen Intubation Type: IV induction Ventilation: Mask ventilation without difficulty LMA: LMA inserted LMA Size: 4.0 Number of attempts: 1 Airway Equipment and Method: bite block Placement Confirmation: positive ETCO2 and breath sounds checked- equal and bilateral Tube secured with: Tape Dental Injury: Teeth and Oropharynx as per pre-operative assessment

## 2012-02-21 NOTE — Op Note (Signed)
Tina Parks [birth year] [id number] 02/08/2012  Preoperative diagnosis: breast cancer, left breast, upper inner quadrant, clinical stage I  Postoperative diagnosis: same;   Procedure: left wire localized partial mastectomy with blue dye injection and axillary sentinel lymph node excision  Surgeon: Currie Paris, MD, FACS   Anesthesia: General   Clinical History and Indications: this patient recently had a mammogram showing a lesion in the upper inner quadrant left breast and a needle core biopsy showed invasive carcinoma. After thorough evaluation she elected to proceed to wire localized excision and sentinel lymph node removal with plans for followup radiation    Description of Procedure: I saw the patient appear. From the plans and marked the left breast. I reviewed the wire localizing films. The radiologist place an ex-mark on the skin overlying the tumor which was just lateral to the guidewire entry site in the upper inner quadrant.  The patient was taken to the operating room after satisfactory general anesthesia was obtained a timeout was done. 5 cc of dilute methylene blue was injected subareolaron the left side and then massaged in. A full prep and drape was then done.I made an incision over the guidewire tract which started medial and tracked laterally. A recent skin flaps and then divided the breast tissue down to the chest wall first superior than medial to and inferior. I then began to remove the tissue staying underneath it and taking the fascia from the muscle. Then with traction I was able to divide the lateral attachments. Specimen mammogram showed the clip in the tumor to be in the middle of the specimen.I reviewed the nature raising was dried. A 20 cc 0.25% plain Marcaine to help with postop pain relief. I elevated the breast tissue off of the muscle to facilitate closure. Close breast and the subcutaneous tissue with 3-0 Vicryl and then skin with 4-0 Monocryl  subcuticular.Attention was then turned to the axilla. The neoprobe was used to identify higher in a transverse incision made. The extra fat pad was entered. Also medially I found a blue lymphatic leading to a blue lymph node. This was grasped with a Babcock clamp.At this point the anesthetist notified me that the patient was bradycardic and in fact came to asystole. The drapes were quickly removed. Cardial pulmonary resuscitation with chest compressions was begun. Anesthesia administered and were struck including epinephrine atropine. The patient quickly developed a rhythm and blood pressure. Once anesthesia assured [place] that she was stable the whole drapes were taken off and the patient reprepped and draped.The patient then had completion of the central node excision using cautery to remove the central low diet additionally identified plus second adjacent node. With that node removed there is no palpable abnormality no blue lymph nodes and no hot areas. This incision was closed with 3-0 Vicryl and 4 model subcuticular and Dermabond.Shortly after the chest compressions and it I noticed that the lumpectomy incision had become quite swollen and I thought it developed a hematoma probably from the trauma of the chest compressions. All the sutures were opened and there was about a 30-40 cc blood clot present. This was removed. Copious irrigation was undertaken. Once everything was completely dry reclosed. Clips were used to mark the margins at closure.the patient subsequently taken to the PACU for monitoring.Estimated blood loss was 75 cc. There are no additional complications noted. Counts were correct.02/21/2012 12:32 PM

## 2012-02-21 NOTE — Progress Notes (Signed)
Spoke with pt and she appears to be aware of what transpired in the OR today.  She has no chest pain or particular tenderness of her sternum or ribs from the brief CPR episode.  Cardiac work up is continuing.

## 2012-02-21 NOTE — Transfer of Care (Signed)
Immediate Anesthesia Transfer of Care Note  Patient: Tina Parks  Procedure(s) Performed: Procedure(s) (LRB) with comments: BREAST LUMPECTOMY WITH NEEDLE LOCALIZATION AND AXILLARY SENTINEL LYMPH NODE BX (Left)  Patient Location: PACU  Anesthesia Type:General  Level of Consciousness: awake, alert  and oriented  Airway & Oxygen Therapy: Patient Spontanous Breathing and Patient connected to face mask oxygen  Post-op Assessment: Report given to PACU RN, Post -op Vital signs reviewed and stable and Patient moving all extremities  Post vital signs: Reviewed and stable  Complications: cardiovascular complications

## 2012-02-21 NOTE — Anesthesia Postprocedure Evaluation (Signed)
  Anesthesia Post-op Note  Patient: Tina Parks  Procedure(s) Performed: Procedure(s) (LRB) with comments: BREAST LUMPECTOMY WITH NEEDLE LOCALIZATION AND AXILLARY SENTINEL LYMPH NODE BX (Left)  Patient Location: PACU  Anesthesia Type:General  Level of Consciousness: awake, alert  and oriented  Airway and Oxygen Therapy: Patient Spontanous Breathing and Patient connected to face mask oxygen  Post-op Pain: mild  Post-op Assessment: Post-op Vital signs reviewed, Patient's Cardiovascular Status Stable, Respiratory Function Stable, Patent Airway, No signs of Nausea or vomiting and Pain level controlled  Post-op Vital Signs: Reviewed  Complications: cardiovascular complications: pt had brief  episode of severe bradycardia requiring CPR and Epi treatment with quick recovery.  After brief recovery in PACU, she was sent by [facility] to [id number] at [facility] for further evaluation.

## 2012-02-21 NOTE — Progress Notes (Signed)
Patient ID: Tina Parks, female   DOB: 07/16/1970, 42 y.o.   MRN: [id number] Post-op check HD stable. Good pain control. Appreciate cardiology evaluation.  Violeta Gelinas, MD, MPH, FACS Pager: [phone]

## 2012-02-21 NOTE — Progress Notes (Signed)
During surgery, the pt had a sudden episode of bradycardia down to 30-35 which quickly became asystole.  There was no pulse felt at the carotid and CPR was begun.  Epinephrine was given and only ~30 sec of CPR was given.  The LMA remained in place and she recovered quickly with a hypertensive response to the Epi.  She breathed on her own quite well through the remainder of the case and was exrubated uneventfully.  She was awake and alert in the PACU and understood what had happened.  I spoke with Dr. Elly Modena who will see her in 2900 at [facility] on her arrival.  Post op ECG showed possible QT interval being prolonged.  She has no hx of any cardiac problems by her husband's history.

## 2012-02-21 NOTE — Interval H&P Note (Signed)
History and Physical Interval Note:  02/21/2012 10:31 AM  Tina Parks  has presented today for surgery, with the diagnosis of left breast cancer  The various methods of treatment have been discussed with the patient and family. After consideration of risks, benefits and other options for treatment, the patient has consented to  Procedure(s) (LRB) with comments: BREAST LUMPECTOMY WITH NEEDLE LOCALIZATION AND AXILLARY SENTINEL LYMPH NODE BX (Left) as a surgical intervention .  The patient's history has been reviewed, patient examined, no change in status, stable for surgery.  I have reviewed the patient's chart and labs.  Questions were answered to the patient's satisfaction.   Left breast is marked at th operative side  [name] J

## 2012-02-21 NOTE — OR Nursing (Signed)
Intra-operatively while Dr. Jamey Ripa operating patient became bradycardia and then with no pulse. CPR with chest compressions initiated at 1141. Meds given per anesthesia and pulse and rhythm resumed. Surgery finished and patient transported with monitor to PACU. See anesthesia and code record.

## 2012-02-21 NOTE — Care Management Note (Signed)
    Page 1 of 1   02/21/2012     2:53:29 PM   CARE MANAGEMENT NOTE 02/21/2012  Patient:  Tina Parks,Tina Parks   Account Number:  [id number]  Date Initiated:  02/21/2012  Documentation initiated by:  Junius Creamer  Subjective/Objective Assessment:   adm w breast bx in intraoperative brady     Action/Plan:   lives w husband, pcp dr Ricka Burdock   Anticipated DC Date:     Anticipated DC Plan:        DC Planning Services  CM consult      Choice offered to / List presented to:             Status of service:   Medicare Important Message given?   (If response is "NO", the following Medicare IM given date fields will be blank) Date Medicare IM given:   Date Additional Medicare IM given:    Discharge Disposition:    Per UR Regulation:  Reviewed for med. necessity/level of care/duration of stay  If discussed at Long Length of Stay Meetings, dates discussed:    Comments:  1/23 1450 debbie Hezakiah Champeau rn,bsn

## 2012-02-21 NOTE — Progress Notes (Signed)
Emotional support during breast injections °

## 2012-02-21 NOTE — Anesthesia Preprocedure Evaluation (Addendum)
Anesthesia Evaluation  Patient identified by MRN, date of birth, ID band Patient awake    Reviewed: Allergy & Precautions, H&P , NPO status , Patient's Chart, lab work & pertinent test results  History of Anesthesia Complications (+) Family history of anesthesia reaction  Airway Mallampati: I TM Distance: >3 FB Neck ROM: Full    Dental  (+) Teeth Intact and Dental Advisory Given   Pulmonary  breath sounds clear to auscultation        Cardiovascular Rhythm:Regular Rate:Normal     Neuro/Psych    GI/Hepatic   Endo/Other    Renal/GU      Musculoskeletal   Abdominal   Peds  Hematology   Anesthesia Other Findings Mother has severe nausea after anesthesia.  Reproductive/Obstetrics                           Anesthesia Physical Anesthesia Plan  ASA: II  Anesthesia Plan: General   Post-op Pain Management:    Induction: Intravenous  Airway Management Planned: LMA  Additional Equipment:   Intra-op Plan:   Post-operative Plan: Extubation in OR  Informed Consent: I have reviewed the patients History and Physical, chart, labs and discussed the procedure including the risks, benefits and alternatives for the proposed anesthesia with the patient or authorized representative who has indicated his/her understanding and acceptance.   Dental advisory given  Plan Discussed with: CRNA, Anesthesiologist and Surgeon  Anesthesia Plan Comments:         Anesthesia Quick Evaluation

## 2012-02-22 ENCOUNTER — Ambulatory Visit (INDEPENDENT_AMBULATORY_CARE_PROVIDER_SITE_OTHER): Payer: 59

## 2012-02-22 ENCOUNTER — Other Ambulatory Visit: Payer: Self-pay

## 2012-02-22 DIAGNOSIS — I498 Other specified cardiac arrhythmias: Secondary | ICD-10-CM

## 2012-02-22 DIAGNOSIS — R001 Bradycardia, unspecified: Secondary | ICD-10-CM

## 2012-02-22 DIAGNOSIS — I469 Cardiac arrest, cause unspecified: Secondary | ICD-10-CM

## 2012-02-22 LAB — CBC
Hemoglobin: 11.9 g/dL — ABNORMAL LOW (ref 12.0–15.0)
MCHC: 34.2 g/dL (ref 30.0–36.0)
RDW: 12.6 % (ref 11.5–15.5)
WBC: 7.4 10*3/uL (ref 4.0–10.5)

## 2012-02-22 MED ORDER — SODIUM CHLORIDE 0.9 % IV BOLUS (SEPSIS)
500.0000 mL | Freq: Once | INTRAVENOUS | Status: AC
  Administered 2012-02-22: 500 mL via INTRAVENOUS

## 2012-02-22 NOTE — Progress Notes (Addendum)
Patient ID: Tina Parks, female   DOB: 04-06-1970, 42 y.o.   MRN: [id number]    SUBJECTIVE: No events on telemetry.  Feeling well.      Marland Kitchen  ceFAZolin (ANCEF) IV  1 g Intravenous Q8H  . Chlorhexidine Gluconate Cloth  6 each Topical Q0600  . montelukast  10 mg Oral QHS  . mupirocin ointment  1 application Nasal BID      Filed Vitals:   02/21/12 2357 02/22/12 0000 02/22/12 0400 02/22/12 0724  BP:  96/50 92/49   Pulse:      Temp: 98 F (36.7 C) 98 F (36.7 C) 98.3 F (36.8 C) 98.4 F (36.9 C)  TempSrc: Oral Oral Oral Oral  Resp:   16 16  Height:      Weight:      SpO2:    100%    Intake/Output Summary (Last 24 hours) at 02/22/12 0755 Last data filed at 02/22/12 0600  Gross per 24 hour  Intake   4595 ml  Output    950 ml  Net   3645 ml    LABS: Basic Metabolic Panel:  Basename 02/21/12 1541 02/21/12 1250  NA -- 140  K -- 4.1  CL -- 104  CO2 -- 26  GLUCOSE -- 122*  BUN -- 14  CREATININE -- 0.71  CALCIUM -- 8.5  MG 1.8 --  PHOS -- --   Liver Function Tests:  Basename 02/21/12 1250  AST 20  ALT 16  ALKPHOS 34*  BILITOT 0.2*  PROT 5.7*  ALBUMIN 3.3*   No results found for this basename: LIPASE:2,AMYLASE:2 in the last 72 hours CBC:  Basename 02/22/12 0510 02/21/12 1541  WBC 7.4 6.5  NEUTROABS -- 5.9  HGB 11.9* 13.5  HCT 34.8* 39.2  MCV 88.3 89.5  PLT 173 176   Cardiac Enzymes: No results found for this basename: CKTOTAL:3,CKMB:3,CKMBINDEX:3,TROPONINI:3 in the last 72 hours BNP: No components found with this basename: POCBNP:3 D-Dimer: No results found for this basename: DDIMER:2 in the last 72 hours Hemoglobin A1C: No results found for this basename: HGBA1C in the last 72 hours Fasting Lipid Panel: No results found for this basename: CHOL,HDL,LDLCALC,TRIG,CHOLHDL,LDLDIRECT in the last 72 hours Thyroid Function Tests:  Basename 02/21/12 1541  TSH 0.759  T4TOTAL --  T3FREE --  THYROIDAB --   Anemia Panel: No results found for this  basename: VITAMINB12,FOLATE,FERRITIN,TIBC,IRON,RETICCTPCT in the last 72 hours  RADIOLOGY: Nm Sentinel Node Inj-no Rpt (breast)  02/21/2012  CLINICAL DATA: left breast cancer   Sulfur colloid was injected intradermally by the nuclear medicine  technologist for breast cancer sentinel node localization.     US Breast Bx W Loc Dev 1st Lesion Img Bx Spec US Guide  02/21/2012  *RADIOLOGY REPORT*  Clinical Data:  left upper inner quadrant carcinoma  NEEDLE LOCALIZATION USING ULTRASOUND GUIDANCE AND SPECIMEN RADIOGRAPH  Comparison: Previous exams.  Patient presents for needle localization prior to lumpectomy.  I met with the patient and we discussed the procedure of needle localization including benefits and alternatives. We discussed the high likelihood of a successful procedure. We discussed the risks of the procedure, including infection, bleeding, tissue injury, and further surgery. Informed, written consent was given.  Using ultrasound guidance, sterile technique, 2% lidocaine and a 5cm modified Kopans needle, the mass and clip were localized using a radial oblique approach.  The films are marked for Dr. Jamey Ripa.  Specimen radiograph is performed at , and confirms marker clip present in the tissue sample.  The  specimen is marked for pathology.  IMPRESSION: Needle localization left breast.  No apparent complications.   Original Report Authenticated By: Esperanza Heir, M.D.     PHYSICAL EXAM General: NAD Neck: No JVD, no thyromegaly or thyroid nodule.  Lungs: Clear to auscultation bilaterally with normal respiratory effort. CV: Nondisplaced PMI.  Heart regular S1/S2, no S3/S4, no murmur.  No peripheral edema.  No carotid bruit.  Normal pedal pulses.  Abdomen: Soft, nontender, no hepatosplenomegaly, no distention.  Neurologic: Alert and oriented x 3.  Psych: Normal affect. Extremities: No clubbing or cyanosis.   TELEMETRY: Reviewed telemetry pt in NSR  ASSESSMENT AND PLAN:  42 yo with history of  breast cancer developed bradycardia with PEA arrest during surgery.  She has a history of vasovagal events.  This certainly may have been a vagal event.  No arrhythmias overnight.  She will get an echo this morning and if normal, home with 2 week event monitor.   Tina Parks 02/22/2012 7:58 AM   During echo, patient experienced significant pain at her surgical site.  She then felt lightheaded.  She briefly went into a junction bradycardia in high 30s-40s then back to NSR when procedure was stopped.  She did not get extremely slow, so suspect that the BP drop may have been more driven by vasodilation.  This supports vasovagal etiology of symptoms yesterday with surgery.  Will re-attempt echo after she gets pain mediction.   Tina Parks 02/22/2012 10:24 AM

## 2012-02-22 NOTE — Progress Notes (Signed)
2D echo reviewed with Dr. Shirlee Latch. Normal EF 55-60%, no significant valvular disease, RV not well visualized but appeared normal. (Note on initial echo it says severe PR but confirmed with Dr. Shirlee Latch this is a transcription error that will be addended). Suspect vasovagal etiology. BP borderline but pt is asymptomatic and ambulating well with baseline BP at home in the 90's.  Recommendations: - 2 week event monitor - our office will contact her to arrange pickup date/time - follow up in our office with Dr. Shirlee Latch 03/24/12 at 9:15am - no driving for 1 week - if she is felt to need surgery in the future, she will need cardiology office appointment prior to discuss if any further testing is necessary  Dr. Tenna Child office notified as requested. He has already provided the patient and her husband with prescriptions and post-op instructions.  Dayna Dunn PA-C

## 2012-02-22 NOTE — Progress Notes (Signed)
Pt's BP 85/48.  Pt asymptomatic.  Ronie Spies, PA on floor to see pt and notified.  Will continue to monitor.

## 2012-02-22 NOTE — Progress Notes (Signed)
  Echocardiogram 2D Echocardiogram has been performed.  Tina Parks A 02/22/2012, 11:51 AM

## 2012-02-22 NOTE — Progress Notes (Signed)
Placed a 14 day event monitor patient and went over instructions on how to use it and when to return it

## 2012-02-22 NOTE — Progress Notes (Signed)
During 2D echo pt became lightheaded and nauseated with pain at surgical site.  Heart rhythm junctional with Hr down to 32.  Pt then went back into sinus rhythm.  BP 86/50- 100/60.  12 lead ecg obtained and 4mg  zofran, and 1 tab percocet given.  Theodore Demark, PA notified.  No new orders at this time.  Will continue to monitor.

## 2012-02-22 NOTE — Progress Notes (Signed)
1 Day Post-Op   Assessment: s/p Procedure(s): BREAST LUMPECTOMY WITH NEEDLE LOCALIZATION AND AXILLARY SENTINEL LYMPH NODE BX Patient Active Problem List  Diagnosis  . CIN I (cervical intraepithelial neoplasia I)  . Stress fracture  . Sinusitis  . Bronchial spasm  . Cancer of upper-inner quadrant of female breast  . Cardiac arrest during or resulting from a procedure    Improved, stable post op  Plan: Discharge When clear with cards.  Subjective: Feels better, minimal pain, ambulating  Objective: Vital signs in last 24 hours: Temp:  [97.7 F (36.5 C)-98.3 F (36.8 C)] 98.3 F (36.8 C) (01/24 0400) Pulse Rate:  [60-99] 87  (01/23 1700) Resp:  [11-18] 16  (01/24 0400) BP: (92-117)/(49-75) 92/49 mmHg (01/24 0400) SpO2:  [95 %-100 %] 98 % (01/23 1800) Weight:  [153 lb (69.4 kg)] 153 lb (69.4 kg) (01/23 0908)   Intake/Output from previous day: 01/23 0701 - 01/24 0700 In: 4595 [P.O.:600; I.V.:3895; IV Piggyback:100] Out: 950 [Urine:950] Intake/Output this shift:     General appearance: alert, cooperative, distracted and no distress  Incision: healing well, no significant drainage  Lab Results:   Basename 02/22/12 0510 02/21/12 1541  WBC 7.4 6.5  HGB 11.9* 13.5  HCT 34.8* 39.2  PLT 173 176   BMET  Basename 02/21/12 1250  NA 140  K 4.1  CL 104  CO2 26  GLUCOSE 122*  BUN 14  CREATININE 0.71  CALCIUM 8.5   PT/INR  Basename 02/21/12 1250  LABPROT 13.7  INR 1.06   ABG No results found for this basename: PHART:2,PCO2:2,PO2:2,HCO3:2 in the last 72 hours  MEDS, Scheduled    .  ceFAZolin (ANCEF) IV  1 g Intravenous Q8H  . Chlorhexidine Gluconate Cloth  6 each Topical Q0600  . montelukast  10 mg Oral QHS  . mupirocin ointment  1 application Nasal BID    Studies/Results: Nm Sentinel Node Inj-no Rpt (breast)  02/21/2012  CLINICAL DATA: left breast cancer   Sulfur colloid was injected intradermally by the nuclear medicine  technologist for breast  cancer sentinel node localization.     US Breast Bx W Loc Dev 1st Lesion Img Bx Spec US Guide  02/21/2012  *RADIOLOGY REPORT*  Clinical Data:  left upper inner quadrant carcinoma  NEEDLE LOCALIZATION USING ULTRASOUND GUIDANCE AND SPECIMEN RADIOGRAPH  Comparison: Previous exams.  Patient presents for needle localization prior to lumpectomy.  I met with the patient and we discussed the procedure of needle localization including benefits and alternatives. We discussed the high likelihood of a successful procedure. We discussed the risks of the procedure, including infection, bleeding, tissue injury, and further surgery. Informed, written consent was given.  Using ultrasound guidance, sterile technique, 2% lidocaine and a 5cm modified Kopans needle, the mass and clip were localized using a radial oblique approach.  The films are marked for Dr. Jamey Ripa.  Specimen radiograph is performed at , and confirms marker clip present in the tissue sample.  The specimen is marked for pathology.  IMPRESSION: Needle localization left breast.  No apparent complications.   Original Report Authenticated By: Esperanza Heir, M.D.       LOS: 1 day     Currie Paris, MD, [facility], [place] [phone]   02/22/2012 7:26 AM

## 2012-02-25 ENCOUNTER — Encounter (HOSPITAL_BASED_OUTPATIENT_CLINIC_OR_DEPARTMENT_OTHER): Payer: Self-pay | Admitting: Surgery

## 2012-02-29 NOTE — Discharge Summary (Signed)
Patient ID: Tina Parks [id number] 41 y.o. 22-Dec-1970  Admission  Date: 02/21/2012  Discharge date and time: 02/22/2012  4:16 PM  Admitting Physician: Currie Paris  Discharge Physician: Currie Paris  Admission Diagnoses: left breast cancer  Discharge Diagnoses:  1. Clinical stage I left breast cancer, upper inner quadrant, receptor positive 2.cardiac arrest during his surgical procedure, uncertain etiology  Operations: Procedure(s): BREAST LUMPECTOMY WITH NEEDLE LOCALIZATION AND AXILLARY SENTINEL LYMPH NODE BX  Discharged Condition: good  Hospital Course: this patient underwent wire localized lumpectomy and sentinel lymph node excision on 02/21/2012. It is a procedure she developed a severe bradycardia leading to asystole. Drugs were measured as was chest compressions. Cardiac rhythm was restored within 30 seconds. Postoperative EKG did not show evidence of myocardial infarction. She was transferred to medical intensive care for 24 hours of monitoring and cardiac consultation. She was stable postoperatively with no evidence of wound problems from the surgery. No etiology for the episode of asystole was located but plans are made for cardiac monitoring postoperatively.  Consults: cardiology  Significant Diagnostic Studies: Path: Diagnosis 1. Breast, lumpectomy, Left - INVASIVE GRADE I DUCTAL CARCINOMA, SPANNING 0.3 CM IN GREATEST DIMENSION - LYMPH/VASCULAR INVASION IS NOT IDENTIFIED. - MARGINS ARE NEGATIVE. - SEE ONCOLOGY TEMPLATE 2. Lymph node, sentinel, biopsy, Left axillary #1 - ONE BENIGN LYMPH NODE WITH NO TUMOR SEEN (0/1). 3. Lymph node, sentinel, biopsy, Left axillary #2 - ONE BENIGN LYMPH NODE WITH NO TUMOR SEEN (0/1).  Echo: Transthoracic Echocardiography  (Report amended )  Patient: Tina Parks, Tina Parks MR #: [id number] Study Date: 02/22/2012 Gender: F Age: 53 Height: 167.6cm Weight: 69.4kg BSA: 1.35m^2 Pt. Status: Room: 2908  PERFORMING  [facility] Theodore Demark SONOGRAPHER Mathews Argyle, RDCS, ARDMS cc:  ------------------------------------------------------------ LV EF: 55% - 60%  ------------------------------------------------------------ Indications: Cardiac arrest 427.5.  ------------------------------------------------------------ Study Conclusions  - Left ventricle: The cavity size was normal. Wall thickness was normal. Systolic function was normal. The estimated ejection fraction was in the range of 55% to 60%. Although no diagnostic regional wall motion abnormality was identified, this possibility cannot be completely excluded on the basis of this study. Left ventricular diastolic function parameters were normal. - Aortic valve: There was no stenosis. - Mitral valve: Trivial regurgitation. - Right ventricle: Poorly visualized. The cavity size was normal. Systolic function was normal. - Pulmonary arteries: No complete TR doppler jet so unable to estimate PA systolic pressure. - Inferior vena cava: The vessel was normal in size; the respirophasic diameter changes were in the normal range (= 50%); findings are consistent with normal central venous pressure. Impressions:  - Limited Echo. No parasternal windows. Normal LV size and systolic function, EF 55-60%. Normal diastolic function. Poorly visualized RV but appears normal. No significant valvular abnormalities.     Disposition: Home

## 2012-03-06 ENCOUNTER — Encounter (INDEPENDENT_AMBULATORY_CARE_PROVIDER_SITE_OTHER): Payer: Self-pay | Admitting: General Surgery

## 2012-03-11 ENCOUNTER — Ambulatory Visit (INDEPENDENT_AMBULATORY_CARE_PROVIDER_SITE_OTHER): Payer: Commercial Managed Care - PPO | Admitting: Surgery

## 2012-03-11 ENCOUNTER — Encounter (INDEPENDENT_AMBULATORY_CARE_PROVIDER_SITE_OTHER): Payer: Self-pay | Admitting: Surgery

## 2012-03-11 VITALS — BP 108/62 | HR 88 | Resp 16 | Ht 66.5 in | Wt 152.0 lb

## 2012-03-11 DIAGNOSIS — Z09 Encounter for follow-up examination after completed treatment for conditions other than malignant neoplasm: Secondary | ICD-10-CM

## 2012-03-11 NOTE — Progress Notes (Signed)
BRELEIGH CARPINO Deer    [id number] 03/11/2012    1970-09-15   CC:  Chief Complaint  Patient presents with  . Routine Post Op    left lumpectomy 02/21/2012     HPI: The patient returns for post op follow-up. She underwent a left NL lumpectomy and sln on 02/21/12. Over all she feels that she is doing well.There have been no noticeable ill effects from her brief intra-operative cardiac arrest   PE: VITAL SIGNS: BP 108/62  Pulse 88  Resp 16  Ht 5' 6.5" (1.689 m)  Wt 152 lb (68.947 kg)  BMI 24.17 kg/m2  LMP 02/14/2012  The incision is healing nicely and there is no evidence of infection or hematoma.    DATA REVIEWED: Pathology report showed 3mm IDC, negative margins and SLN's  IMPRESSION: Patient doing well. OK to start radiation  PLAN: Her next visit will be in two months. Gave her a copy of the path and discussed with her. spoj

## 2012-03-11 NOTE — Patient Instructions (Signed)
See me again about two or three weeks after you finish radiation

## 2012-03-13 ENCOUNTER — Ambulatory Visit: Payer: 59 | Admitting: Oncology

## 2012-03-13 ENCOUNTER — Other Ambulatory Visit: Payer: 59 | Admitting: Lab

## 2012-03-13 ENCOUNTER — Other Ambulatory Visit: Payer: Self-pay

## 2012-03-13 NOTE — Progress Notes (Signed)
S/w pt that we will be rescheduling her appt due to snow. Pt was agreeable

## 2012-03-14 ENCOUNTER — Ambulatory Visit (HOSPITAL_BASED_OUTPATIENT_CLINIC_OR_DEPARTMENT_OTHER): Payer: 59 | Admitting: Oncology

## 2012-03-14 ENCOUNTER — Other Ambulatory Visit (HOSPITAL_BASED_OUTPATIENT_CLINIC_OR_DEPARTMENT_OTHER): Payer: 59 | Admitting: Lab

## 2012-03-14 ENCOUNTER — Telehealth: Payer: Self-pay | Admitting: Oncology

## 2012-03-14 ENCOUNTER — Encounter: Payer: Self-pay | Admitting: Oncology

## 2012-03-14 VITALS — BP 104/71 | HR 75 | Temp 97.4°F

## 2012-03-14 DIAGNOSIS — C50219 Malignant neoplasm of upper-inner quadrant of unspecified female breast: Secondary | ICD-10-CM

## 2012-03-14 DIAGNOSIS — Z170 Estrogen receptor positive status [ER+]: Secondary | ICD-10-CM

## 2012-03-14 DIAGNOSIS — C50519 Malignant neoplasm of lower-outer quadrant of unspecified female breast: Secondary | ICD-10-CM

## 2012-03-14 LAB — COMPREHENSIVE METABOLIC PANEL (CC13)
Albumin: 3.5 g/dL (ref 3.5–5.0)
CO2: 28 mEq/L (ref 22–29)
Calcium: 9.2 mg/dL (ref 8.4–10.4)
Chloride: 107 mEq/L (ref 98–107)
Glucose: 94 mg/dl (ref 70–99)
Potassium: 3.8 mEq/L (ref 3.5–5.1)
Sodium: 140 mEq/L (ref 136–145)
Total Protein: 6.5 g/dL (ref 6.4–8.3)

## 2012-03-14 LAB — CBC WITH DIFFERENTIAL/PLATELET
Basophils Absolute: 0 10*3/uL (ref 0.0–0.1)
Eosinophils Absolute: 0.1 10*3/uL (ref 0.0–0.5)
HGB: 12.5 g/dL (ref 11.6–15.9)
MONO#: 0.4 10*3/uL (ref 0.1–0.9)
NEUT#: 3.1 10*3/uL (ref 1.5–6.5)
RBC: 4.26 10*6/uL (ref 3.70–5.45)
RDW: 13.1 % (ref 11.2–14.5)
WBC: 5.3 10*3/uL (ref 3.9–10.3)

## 2012-03-14 NOTE — Patient Instructions (Addendum)
Proceed with radiation therapy  I will see you back in 2 months

## 2012-03-14 NOTE — Telephone Encounter (Signed)
gv pt appt schedule for April.  °

## 2012-03-14 NOTE — Progress Notes (Signed)
OFFICE PROGRESS NOTE  CC  [place], [place] [address] [place] [address]  DIAGNOSIS: 42 year old female with T1 A. N0 invasive ductal carcinoma of the left breast diagnosed in 12/19/2011  PRIOR THERAPY:  #1 patient was originally seen in the multidisciplinary breast clinic by Dr. Pierce Crane Dr. Lurline Hare and Dr. Cyndia Bent on 12/19/2011 with a new diagnosis of left-sided invasive ductal carcinoma stage I ER positive PR positive HER-2/neu negative. Recommendations for a left lumpectomy with sentinel lymph node biopsy. She underwent this on 02/21/2012. Her final pathology revealed a 0.3 cm invasive ductal carcinoma that was grade 1. No lymphovascular invasion margins were -2 sentinel nodes were negative for metastatic disease tumor was ER +100% PR +100% HER-2/neu negative with a Ki-67 3%.  #2 patient had genetic testing and counseling performed. She had a gene BX panel performed for breast and ovarian cancer panel. 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 She was also found to have a variant FAM175a, c.1139A>G (p.Asp380Gly).  #3 patient to proceed with radiation therapy adjuvantly with Dr. Lurline Hare.  #41 she completes this we will plan on doing adjuvant antiestrogen therapy with tamoxifen 20 mg daily  CURRENT THERAPY:proceed with radiation therapy  INTERVAL HISTORY: Tina Parks 42 y.o. female returns for followup visit post lumpectomy. She is now establishing her care with me since departure of Dr. Donnie Coffin. Clinically she seems to be doing well she is without any problems today. Of note patient did have asked trembly a lot of complications during her surgery including what sounds like having to be resuscitated. She however has recovered from all of this completely and fully. She is without any complaints remainder of the 10 point review of systems is negative  MEDICAL HISTORY: Past Medical  History  Diagnosis Date  . CIN I (cervical intraepithelial neoplasia I)   . Stress fracture 2011    Left Hip  . Depression   . Anxiety   . Sinusitis   . Bronchial spasm     Exercise induced  . Breast cancer   . Family history of anesthesia complication     significant PONV    ALLERGIES:  has No Known Allergies.  MEDICATIONS:  Current Outpatient Prescriptions  Medication Sig Dispense Refill  . montelukast (SINGULAIR) 10 MG tablet Take 10 mg by mouth daily.       . sertraline (ZOLOFT) 50 MG tablet Take 1 tablet (50 mg total) by mouth daily.  90 tablet  4  . tamoxifen (NOLVADEX) 20 MG tablet Take 1 tablet (20 mg total) by mouth daily.  30 tablet  3  . ALPRAZolam (XANAX) 1 MG tablet Take 1 mg by mouth at bedtime as needed. sleep      . valACYclovir (VALTREX) 1000 MG tablet Take 1,000 mg by mouth 2 (two) times daily. For fever blister       No current facility-administered medications for this visit.    SURGICAL HISTORY:  Past Surgical History  Procedure Laterality Date  . Colposcopy    . Wisdom tooth extraction  1987    Sedation only  . Breast lumpectomy with needle localization and axillary sentinel lymph node bx  02/21/2012    Procedure: BREAST LUMPECTOMY WITH NEEDLE LOCALIZATION AND AXILLARY SENTINEL LYMPH NODE BX;  Surgeon: Currie Paris, MD;  Location: [facility];  Service: General;  Laterality: Left;  break in sterile field to initiate CPR  REVIEW OF SYSTEMS:  Pertinent items are noted in HPI.   HEALTH MAINTENANCE:  PHYSICAL EXAMINATION: Blood pressure 104/71, pulse 75, temperature 97.4 F (36.3 C), temperature source Oral. There is no weight on file to calculate BMI. ECOG PERFORMANCE STATUS: 0 - Asymptomatic   General appearance: alert, cooperative and appears stated age Resp: clear to auscultation bilaterally Back: symmetric, no curvature. ROM normal. No CVA tenderness. Cardio: regular rate and rhythm GI: soft, non-tender; bowel sounds  normal; no masses,  no organomegaly Extremities: extremities normal, atraumatic, no cyanosis or edema Neurologic: Grossly normal Right breast: No masses nipple discharge or skin changes. Left breast reveals well-healed excisional scar no masses nipple discharge skin changes.  LABORATORY DATA: Lab Results  Component Value Date   WBC 5.3 03/14/2012   HGB 12.5 03/14/2012   HCT 38.2 03/14/2012   MCV 89.7 03/14/2012   PLT 174 03/14/2012      Chemistry      Component Value Date/Time   NA 140 03/14/2012 1426   NA 140 02/21/2012 1250   K 3.8 03/14/2012 1426   K 4.1 02/21/2012 1250   CL 107 03/14/2012 1426   CL 104 02/21/2012 1250   CO2 28 03/14/2012 1426   CO2 26 02/21/2012 1250   BUN 12.3 03/14/2012 1426   BUN 14 02/21/2012 1250   CREATININE 0.9 03/14/2012 1426   CREATININE 0.71 02/21/2012 1250      Component Value Date/Time   CALCIUM 9.2 03/14/2012 1426   CALCIUM 8.5 02/21/2012 1250   ALKPHOS 41 03/14/2012 1426   ALKPHOS 34* 02/21/2012 1250   AST 14 03/14/2012 1426   AST 20 02/21/2012 1250   ALT 20 03/14/2012 1426   ALT 16 02/21/2012 1250   BILITOT 0.26 03/14/2012 1426   BILITOT 0.2* 02/21/2012 1250     ADDITIONAL INFORMATION: 1. CHROMOGENIC IN-SITU HYBRIDIZATION Interpretation HER-2/NEU BY CISH - NO AMPLIFICATION OF HER-2 DETECTED. THE RATIO OF HER-2: CEP 17 SIGNALS WAS 1.40. Reference range: Ratio: HER2:CEP17 < 1.8 - gene amplification not observed Ratio: HER2:CEP 17 1.8-2.2 - equivocal result Ratio: HER2:CEP17 > 2.2 - gene amplification observed Jimmy Picket MD Pathologist, Electronic Signature ( Signed 02/29/2012) FINAL DIAGNOSIS Diagnosis 1. Breast, lumpectomy, Left - INVASIVE GRADE I DUCTAL CARCINOMA, SPANNING 0.3 CM IN GREATEST DIMENSION - LYMPH/VASCULAR INVASION IS NOT IDENTIFIED. - MARGINS ARE NEGATIVE. - SEE ONCOLOGY TEMPLATE 2. Lymph node, sentinel, biopsy, Left axillary #1 - ONE BENIGN LYMPH NODE WITH NO TUMOR SEEN (0/1). 3. Lymph node, sentinel, biopsy, Left axillary  #2 - ONE BENIGN LYMPH NODE WITH NO TUMOR SEEN (0/1). Microscopic Comment 1. BREAST, INVASIVE TUMOR, WITH LYMPH NODE SAMPLING 1 of 3 FINAL for Wrench, Letesha S ([id number]) Microscopic Comment(continued) Specimen, including laterality: Left partial breast. Procedure: Left breast lumpectomy. Grade: I. Tubule formation: 1. Nuclear pleomorphism: 1. Mitotic: 1. Tumor size (glass slide measurement): 0.3 cm. Margins: Invasive, distance to closest margin: 0.25 cm (posterior margin). Lymphovascular invasion: Not identified. Ductal carcinoma in situ: Not identified. Lobular neoplasia: Not identified. Tumor focality: Unifocal. Treatment effect: N/A. Extent of tumor: Tumor confined to breast parenchyma. Lymph nodes: # examined: 2. Lymph nodes with metastasis: 0. Breast prognostic profile: Performed on previous case ([id number]) Estrogen receptor: 100%, positive. Progesterone receptor: 100%, positive. Her 2 neu: 1.52, no amplification. Ki-67: 3%, low. Non-neoplastic breast: Fibrocystic changes with calcification. TNM: pT1a, pN0, pMX. Comments: Due to the lobulated and bland appearance of the tumor, immunohistochemical stains are performed to prove that invasive carcinoma is present. In the focus suspicious for tumor, the  proliferating area shows negativity for p63, calponin and smooth muscle myosin confirming the above diagnosis. Of note, there is very limited tumor on the left breast lumpectomy specimen with the largest focus measuring 0.3 cm. The previous needle core biopsy is re-reviewed which shows a focus of invasive ductal carcinoma spanning 0.4 cm. Regardless, the T stage is pT1a. A Her-2 neu by CISH will be repeated on the current tumor and reported in an addendum. (RH:kh 02-25-12) Zandra Abts MD Pathologist, Electronic Signature (Case signed 02/26/2012) Specimen Gross and  RADIOGRAPHIC STUDIES:  Nm Sentinel Node Inj-no Rpt (breast)  02/21/2012  CLINICAL DATA: left  breast cancer   Sulfur colloid was injected intradermally by the nuclear medicine  technologist for breast cancer sentinel node localization.     US Breast Bx W Loc Dev 1st Lesion Img Bx Spec US Guide  02/21/2012  *RADIOLOGY REPORT*  Clinical Data:  left upper inner quadrant carcinoma  NEEDLE LOCALIZATION USING ULTRASOUND GUIDANCE AND SPECIMEN RADIOGRAPH  Comparison: Previous exams.  Patient presents for needle localization prior to lumpectomy.  I met with the patient and we discussed the procedure of needle localization including benefits and alternatives. We discussed the high likelihood of a successful procedure. We discussed the risks of the procedure, including infection, bleeding, tissue injury, and further surgery. Informed, written consent was given.  Using ultrasound guidance, sterile technique, 2% lidocaine and a 5cm modified Kopans needle, the mass and clip were localized using a radial oblique approach.  The films are marked for Dr. Jamey Ripa.  Specimen radiograph is performed at , and confirms marker clip present in the tissue sample.  The specimen is marked for pathology.  IMPRESSION: Needle localization left breast.  No apparent complications.   Original Report Authenticated By: Esperanza Heir, M.D.     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 Final pathology revealing a 0.3 cm disease sentinel nodes were negative for metastatic disease. Postoperatively patient is doing well. She is clinical stage I.  #2 patient also has some variance on her genetic testing but most likely these are benign.  #3 she will need adjuvant radiation therapy and antiestrogen therapy. However she will proceed with the radiation first. Her antiestrogen therapy will consist of tamoxifen since she is premenopausal.   PLAN:   #1 proceed with radiation therapy she is referred to doctors [facility].  #2 she  will return in about 2 months time for followup to begin antiestrogen therapy.   All questions were answered. The patient knows to call the clinic with any problems, questions or concerns. We can certainly see the patient much sooner if necessary.  I spent 40 minutes counseling the patient face to face. The total time spent in the appointment was 40 minutes.    Drue Second, MD Medical/Oncology [facility] [phone] (beeper) [phone] (Office)  03/14/2012, 3:38 PM

## 2012-03-20 ENCOUNTER — Ambulatory Visit: Payer: 59

## 2012-03-20 ENCOUNTER — Encounter: Payer: Self-pay | Admitting: Radiation Oncology

## 2012-03-20 ENCOUNTER — Ambulatory Visit: Payer: 59 | Admitting: Radiation Oncology

## 2012-03-20 DIAGNOSIS — C50919 Malignant neoplasm of unspecified site of unspecified female breast: Secondary | ICD-10-CM | POA: Insufficient documentation

## 2012-03-21 ENCOUNTER — Ambulatory Visit
Admission: RE | Admit: 2012-03-21 | Discharge: 2012-03-21 | Disposition: A | Discharge status: Home / Self Care | Payer: 59 | Source: Ambulatory Visit | Attending: Radiation Oncology | Admitting: Radiation Oncology

## 2012-03-21 ENCOUNTER — Encounter: Payer: Self-pay | Admitting: Radiation Oncology

## 2012-03-21 VITALS — BP 101/68 | HR 76 | Temp 98.3°F | Resp 20 | Wt 151.8 lb

## 2012-03-21 DIAGNOSIS — C50219 Malignant neoplasm of upper-inner quadrant of unspecified female breast: Secondary | ICD-10-CM

## 2012-03-21 DIAGNOSIS — Z79899 Other long term (current) drug therapy: Secondary | ICD-10-CM | POA: Insufficient documentation

## 2012-03-21 DIAGNOSIS — C50919 Malignant neoplasm of unspecified site of unspecified female breast: Secondary | ICD-10-CM | POA: Insufficient documentation

## 2012-03-21 DIAGNOSIS — Z510 Encounter for antineoplastic radiation therapy: Secondary | ICD-10-CM | POA: Insufficient documentation

## 2012-03-21 NOTE — Progress Notes (Signed)
Name: Tina Parks   MRN: [id number]  Date:  03/21/2012  DOB: 11/03/1970  Status:outpatient    DIAGNOSIS: Breast cancer.  CONSENT VERIFIED: yes   SET UP: Patient is setup supine   IMMOBILIZATION:  The following immobilization was used:Custom Moldable Pillow, breast board.   NARRATIVE: Ms. Kwasnik was brought to the CT Simulation planning suite.  Identity was confirmed.  All relevant records and images related to the planned course of therapy were reviewed.  Then, the patient was positioned in a stable reproducible clinical set-up for radiation therapy.  Wires were placed to delineate the clinical extent of breast tissue. A wire was placed on the scar as well.  CT images were obtained.  An isocenter was placed. After reviewing the CT images I felt her heart was out of the radiation treatment field and she would not benefit from breath hold.  Skin markings were placed.  The CT images were loaded into the planning software where the target and avoidance structures were contoured.  The radiation prescription was entered and confirmed. The patient was discharged in stable condition and tolerated simulation well.    TREATMENT PLANNING NOTE:  Treatment planning then occurred. I have requested : MLC's, isodose plan, basic dose calculation  I personally designed and supervised the construction of 3 medically necessary complex treatment devices for the protection of critical normal structures including the lungs and contralateral breast as well as the immobilization device which is necessary for set up certainty.

## 2012-03-21 NOTE — Addendum Note (Signed)
Encounter addended by: Glennie Hawk, RN on: 03/21/2012  3:29 PM<BR>     Documentation filed: Charges VN

## 2012-03-21 NOTE — Progress Notes (Signed)
Please see the Nurse Progress Note in the MD Initial Consult Encounter for this patient. 

## 2012-03-21 NOTE — Progress Notes (Signed)
   Department of Radiation Oncology  Phone:  [phone] Fax:        [phone]   Name: Tina Parks MRN: [id number]  DOB: [birth year]  Date: 03/21/2012  Follow Up Visit Note  Diagnosis: T1aN0 Invasive ductal carcinoma of the left breast  Interval History: Tina Parks presents today for routine followup.  She recovered well from her surgery. She had a margin negative resection and no sentinel lymph nodes positive. Invasive component measures 3 mm. She met with Dr. Welton Flakes who recommended at adjuvant antiestrogen therapy. She is ready to proceed on with radiation.  Allergies: No Known Allergies  Medications:  Current Outpatient Prescriptions  Medication Sig Dispense Refill  . ALPRAZolam (XANAX) 1 MG tablet Take 1 mg by mouth at bedtime as needed. sleep      . montelukast (SINGULAIR) 10 MG tablet Take 10 mg by mouth daily.       . sertraline (ZOLOFT) 50 MG tablet Take 1 tablet (50 mg total) by mouth daily.  90 tablet  4  . tamoxifen (NOLVADEX) 20 MG tablet Take 1 tablet (20 mg total) by mouth daily.  30 tablet  3  . valACYclovir (VALTREX) 1000 MG tablet Take 1,000 mg by mouth 2 (two) times daily. For fever blister       No current facility-administered medications for this encounter.    Physical Exam:  Filed Vitals:   03/21/12 0833  BP: 101/68  Pulse: 76  Temp: 98.3 F (36.8 C)  Resp: 20   she has a well-healed surgical incision over the upper inner quadrant of the left breast  IMPRESSION: Tina Parks is a 42 y.o. female status post lumpectomy ready for adjuvant radiation  PLAN:  We discussed the role of radiation in breast conservation. We discussed the role of radiation and decreasing local failures in patients who undergo lumpectomy. We discussed the process of simulation the placement tattoos. We discussed the use of breath will technique for cardiac sparing. We discussed the risk of lung damage and secondary malignancies. We will proceed on for simulation later  today.    Lurline Hare, MD

## 2012-03-21 NOTE — Progress Notes (Signed)
Married, pt and husband [place], she works for [employer], 1 41 yr old son  G1, P77, NuvaRing 1990-2013 Pt denies pain, loss of appetite. She is fatigued due to working hours.

## 2012-03-24 ENCOUNTER — Encounter: Payer: Self-pay | Admitting: Cardiology

## 2012-03-24 ENCOUNTER — Ambulatory Visit (INDEPENDENT_AMBULATORY_CARE_PROVIDER_SITE_OTHER): Payer: 59 | Admitting: Cardiology

## 2012-03-24 VITALS — BP 100/60 | HR 79 | Ht 66.5 in | Wt 150.0 lb

## 2012-03-24 DIAGNOSIS — IMO0002 Reserved for concepts with insufficient information to code with codable children: Secondary | ICD-10-CM

## 2012-03-24 DIAGNOSIS — I519 Heart disease, unspecified: Secondary | ICD-10-CM

## 2012-03-24 NOTE — Patient Instructions (Signed)
Your physician recommends that you schedule a follow-up appointment as needed with Dr McLean.  

## 2012-03-24 NOTE — Progress Notes (Signed)
Patient ID: Tina Parks, female   DOB: 07-Jun-1970, 42 y.o.   MRN: [id number] PCP: Ricka Burdock  42 yo with history of breast cancer presents for followup after having an aystolic arrest during her recent lumpectomy surgery.  Patient does report a history of vasovagal-type symptoms, especially when she was in [place] school and had to draw blood.  She had a left lumpectomy in 1/14.  During the surgery, she was noted to become bradycardic then asystolic.  She required epinephrine and brief CPR before resumption of NSR.   There were no complications during surgery and no significant bleeding. Baseline ECG was normal.  When she was getting an echocardiogram after surgery, the echo tech pushed on her surgical site, increasing her pain and she developed lightheadedness and junctional bradycardia on the monitor but did not pass out. Echo was technically difficult but overall normal.  It was thought that her event was likely vasovagal in nature.    Since discharge, she has done well in general.  She had 1 episode of lightheadedness in a hot shower the day she got home from the hospital, otherwise no problems.  She is going to start radiation therapy soon for breast cancer.  No chest pain or exertional dyspnea.  She wore an event monitor for 3 wks that showed no arrhythmia.   PMH: 1. Breast cancer: s/p left lumpectomy in 1/14, to undergo radiation.  2. Depression. 3. Vasovagal syncope: Patient has had vagal-type episodes in unpleasant situations such as blood draws.  She had bradycardia culminating in asystole during her lumpectomy in 1/14.  Ultimately, this was thought to be a vagal response as well.  Echo (1/14) with EF 55-60%, normal diastolic function, normal valves.  Event monitor (1/14): No significant arrhythmias, NSR.   SH: Works as [employer].  Married with one child.  Nonsmoker.  Lives in [place].  FH: CAD in her grandparents.    ROS: all systems reviewed and negative except as per HPI.   Current  Outpatient Prescriptions  Medication Sig Dispense Refill  . ALPRAZolam (XANAX) 1 MG tablet Take 1 mg by mouth at bedtime as needed. sleep      . montelukast (SINGULAIR) 10 MG tablet Take 10 mg by mouth daily.       . sertraline (ZOLOFT) 50 MG tablet Take 1 tablet (50 mg total) by mouth daily.  90 tablet  4  . tamoxifen (NOLVADEX) 20 MG tablet Take 1 tablet (20 mg total) by mouth daily.  30 tablet  3  . valACYclovir (VALTREX) 1000 MG tablet Take 1,000 mg by mouth as needed. For fever blister       No current facility-administered medications for this visit.   BP 100/60  Pulse 79  Ht 5' 6.5" (1.689 m)  Wt 150 lb (68.04 kg)  BMI 23.85 kg/m2  SpO2 98%  LMP 03/16/2012 General: NAD Neck: No JVD, no thyromegaly or thyroid nodule.  Lungs: Clear to auscultation bilaterally with normal respiratory effort. CV: Nondisplaced PMI.  Heart regular S1/S2, no S3/S4, no murmur.  No peripheral edema.  No carotid bruit.  Normal pedal pulses.  Abdomen: Soft, nontender, no hepatosplenomegaly, no distention.  Neurologic: Alert and oriented x 3.  Psych: Normal affect. Extremities: No clubbing or cyanosis.   Assessment/Plan: 42 yo with bradycardia followed by asystolic arrest during recent surgery.  She has a history of vasovagal-type events.  She had what appeared to be a vasovagal episode when she was getting an echocardiogram last month.  Her echo was  normal.  Her event monitor showed no arrhythmias.  Baseline ECG is normal.  I do suspect that the event during surgery was a profound vagal event.    [name] 03/24/2012 1:52 PM

## 2012-03-28 ENCOUNTER — Ambulatory Visit: Payer: 59

## 2012-03-31 ENCOUNTER — Encounter: Payer: Self-pay | Admitting: Radiation Oncology

## 2012-03-31 ENCOUNTER — Ambulatory Visit
Admission: RE | Admit: 2012-03-31 | Discharge: 2012-03-31 | Disposition: A | Discharge status: Home / Self Care | Payer: 59 | Source: Ambulatory Visit | Attending: Radiation Oncology | Admitting: Radiation Oncology

## 2012-03-31 VITALS — Wt 152.4 lb

## 2012-03-31 DIAGNOSIS — C50212 Malignant neoplasm of upper-inner quadrant of left female breast: Secondary | ICD-10-CM

## 2012-03-31 MED ORDER — RADIAPLEXRX EX GEL
Freq: Once | CUTANEOUS | Status: AC
  Administered 2012-03-31: 14:00:00 via TOPICAL

## 2012-03-31 MED ORDER — ALRA NON-METALLIC DEODORANT (RAD-ONC)
1.0000 "application " | Freq: Once | TOPICAL | Status: AC
  Administered 2012-03-31: 1 via TOPICAL

## 2012-03-31 NOTE — Progress Notes (Signed)
Post sim ed completed w/pt. Gave pt "Radiation and You" booklet w/all pertinent information marked and discussed, re: fatigue, skin irritation/care, nutrition, pain. Gave pt Radiaplex and Alra w/instructions for proper use. Pt verbalized understanding, all questions answered.

## 2012-04-01 ENCOUNTER — Encounter: Payer: Self-pay | Admitting: Radiation Oncology

## 2012-04-01 ENCOUNTER — Ambulatory Visit
Admission: RE | Admit: 2012-04-01 | Discharge: 2012-04-01 | Disposition: A | Discharge status: Home / Self Care | Payer: 59 | Source: Ambulatory Visit | Attending: Radiation Oncology | Admitting: Radiation Oncology

## 2012-04-01 VITALS — BP 110/71 | HR 66 | Temp 98.3°F | Resp 20 | Wt 151.3 lb

## 2012-04-01 DIAGNOSIS — C50212 Malignant neoplasm of upper-inner quadrant of left female breast: Secondary | ICD-10-CM

## 2012-04-01 NOTE — Progress Notes (Signed)
Weekly Management Note Current Dose: 5.34  Gy  Projected Dose: 52.72 Gy   Narrative:  The patient presents for routine under treatment assessment.  CBCT/MVCT images/Port film x-rays were reviewed.  The chart was checked. Doing well. No breast related complaints.  Physical Findings: Weight: 151 lb 4.8 oz (68.629 kg). Unchanged  Impression:  The patient is tolerating radiation.  Plan:  Continue treatment as planned. Continue radiaplex.

## 2012-04-01 NOTE — Progress Notes (Signed)
Pt denies pain, fatigue, loss of appetite. She is applying Radiaplex to left breast tx area. 

## 2012-04-02 ENCOUNTER — Ambulatory Visit
Admission: RE | Admit: 2012-04-02 | Discharge: 2012-04-02 | Disposition: A | Discharge status: Home / Self Care | Payer: 59 | Source: Ambulatory Visit | Attending: Radiation Oncology | Admitting: Radiation Oncology

## 2012-04-03 ENCOUNTER — Ambulatory Visit
Admission: RE | Admit: 2012-04-03 | Discharge: 2012-04-03 | Disposition: A | Discharge status: Home / Self Care | Payer: 59 | Source: Ambulatory Visit | Attending: Radiation Oncology | Admitting: Radiation Oncology

## 2012-04-04 ENCOUNTER — Ambulatory Visit
Admission: RE | Admit: 2012-04-04 | Discharge: 2012-04-04 | Disposition: A | Discharge status: Home / Self Care | Payer: 59 | Source: Ambulatory Visit | Attending: Radiation Oncology | Admitting: Radiation Oncology

## 2012-04-07 ENCOUNTER — Ambulatory Visit
Admission: RE | Admit: 2012-04-07 | Discharge: 2012-04-07 | Disposition: A | Discharge status: Home / Self Care | Payer: 59 | Source: Ambulatory Visit | Attending: Radiation Oncology | Admitting: Radiation Oncology

## 2012-04-08 ENCOUNTER — Ambulatory Visit
Admission: RE | Admit: 2012-04-08 | Discharge: 2012-04-08 | Disposition: A | Discharge status: Home / Self Care | Payer: 59 | Source: Ambulatory Visit | Attending: Radiation Oncology | Admitting: Radiation Oncology

## 2012-04-08 ENCOUNTER — Encounter: Payer: Self-pay | Admitting: Radiation Oncology

## 2012-04-08 VITALS — BP 116/76 | HR 89 | Temp 99.2°F | Resp 20 | Wt 148.9 lb

## 2012-04-08 DIAGNOSIS — C50212 Malignant neoplasm of upper-inner quadrant of left female breast: Secondary | ICD-10-CM

## 2012-04-08 NOTE — Progress Notes (Signed)
Weekly Management Note Current Dose: 18.69  Gy  Projected Dose:  52.72 Gy   Narrative:  The patient presents for routine under treatment assessment.  CBCT/MVCT images/Port film x-rays were reviewed.  The chart was checked. Breast feels "heavy" and more sensitive.   Physical Findings: Weight: 148 lb 14.4 oz (67.541 kg). Slightly pink left breast.   Impression:  The patient is tolerating radiation.  Plan:  Continue treatment as planned. Normal side effects reassured. OK to take antiinflammatory. Continue radiaplex.

## 2012-04-08 NOTE — Progress Notes (Signed)
Pt denies pain, fatigue, loss of appetite. She is applying Radiaplex to left breast tx area.

## 2012-04-09 ENCOUNTER — Ambulatory Visit
Admission: RE | Admit: 2012-04-09 | Discharge: 2012-04-09 | Disposition: A | Discharge status: Home / Self Care | Payer: 59 | Source: Ambulatory Visit | Attending: Radiation Oncology | Admitting: Radiation Oncology

## 2012-04-10 ENCOUNTER — Ambulatory Visit
Admission: RE | Admit: 2012-04-10 | Discharge: 2012-04-10 | Disposition: A | Discharge status: Home / Self Care | Payer: 59 | Source: Ambulatory Visit | Attending: Radiation Oncology | Admitting: Radiation Oncology

## 2012-04-11 ENCOUNTER — Ambulatory Visit
Admission: RE | Admit: 2012-04-11 | Discharge: 2012-04-11 | Disposition: A | Discharge status: Home / Self Care | Payer: 59 | Source: Ambulatory Visit | Attending: Radiation Oncology | Admitting: Radiation Oncology

## 2012-04-14 ENCOUNTER — Ambulatory Visit
Admission: RE | Admit: 2012-04-14 | Discharge: 2012-04-14 | Disposition: A | Discharge status: Home / Self Care | Payer: 59 | Source: Ambulatory Visit | Attending: Radiation Oncology | Admitting: Radiation Oncology

## 2012-04-15 ENCOUNTER — Ambulatory Visit
Admission: RE | Admit: 2012-04-15 | Discharge: 2012-04-15 | Disposition: A | Discharge status: Home / Self Care | Payer: 59 | Source: Ambulatory Visit | Attending: Radiation Oncology | Admitting: Radiation Oncology

## 2012-04-15 ENCOUNTER — Encounter: Payer: Self-pay | Admitting: Radiation Oncology

## 2012-04-15 VITALS — BP 117/70 | HR 75 | Temp 98.8°F | Resp 20 | Wt 153.8 lb

## 2012-04-15 DIAGNOSIS — C50212 Malignant neoplasm of upper-inner quadrant of left female breast: Secondary | ICD-10-CM

## 2012-04-15 NOTE — Progress Notes (Signed)
Pt denies pain, fatigue, loss of appetite. Applying Radiaplex to left breast tx area; has pinkness of skin in tx area.

## 2012-04-15 NOTE — Progress Notes (Signed)
Weekly Management Note Current Dose: 32.04  Gy  Projected Dose: 52.72 Gy   Narrative:  The patient presents for routine under treatment assessment.  CBCT/MVCT images/Port film x-rays were reviewed.  The chart was checked. Doing well. No complaints. Using radiaplex.  Physical Findings: Weight: 153 lb 12.8 oz (69.763 kg). Pink left breast.  Impression:  The patient is tolerating radiation.  Plan:  Continue treatment as planned. Continue radiaplex.

## 2012-04-16 ENCOUNTER — Ambulatory Visit
Admission: RE | Admit: 2012-04-16 | Discharge: 2012-04-16 | Disposition: A | Discharge status: Home / Self Care | Payer: 59 | Source: Ambulatory Visit | Attending: Radiation Oncology | Admitting: Radiation Oncology

## 2012-04-16 ENCOUNTER — Encounter: Payer: Self-pay | Admitting: Radiation Oncology

## 2012-04-16 NOTE — Progress Notes (Signed)
Name: Tina Parks   MRN: [id number]  Date:  04/16/2012   DOB: [birth year]  Status:outpatient    DIAGNOSIS: Breast cancer.  CONSENT VERIFIED: yes   SET UP: Patient is setup supine   IMMOBILIZATION:  The following immobilization was used:Custom Moldable Pillow, breast board.   NARRATIVE: [name] underwent complex simulation and treatment planning for her boost treatment today.  Her tumor volume was outlined on the planning CT scan. The depth of her cavity was 2.9 cm.    9  MeV electrons will be prescribed to the 95% Isodose line.   A block will be used for beam modification purposes.  A special port plan is requested.

## 2012-04-17 ENCOUNTER — Ambulatory Visit
Admission: RE | Admit: 2012-04-17 | Discharge: 2012-04-17 | Disposition: A | Discharge status: Home / Self Care | Payer: 59 | Source: Ambulatory Visit | Attending: Radiation Oncology | Admitting: Radiation Oncology

## 2012-04-18 ENCOUNTER — Ambulatory Visit
Admission: RE | Admit: 2012-04-18 | Discharge: 2012-04-18 | Disposition: A | Discharge status: Home / Self Care | Payer: 59 | Source: Ambulatory Visit | Attending: Radiation Oncology | Admitting: Radiation Oncology

## 2012-04-21 ENCOUNTER — Ambulatory Visit
Admission: RE | Admit: 2012-04-21 | Discharge: 2012-04-21 | Disposition: A | Discharge status: Home / Self Care | Payer: 59 | Source: Ambulatory Visit | Attending: Radiation Oncology | Admitting: Radiation Oncology

## 2012-04-22 ENCOUNTER — Encounter: Payer: Self-pay | Admitting: Radiation Oncology

## 2012-04-22 ENCOUNTER — Ambulatory Visit
Admission: RE | Admit: 2012-04-22 | Discharge: 2012-04-22 | Disposition: A | Discharge status: Home / Self Care | Payer: 59 | Source: Ambulatory Visit | Attending: Radiation Oncology | Admitting: Radiation Oncology

## 2012-04-22 VITALS — BP 109/68 | HR 64 | Temp 98.0°F | Resp 20 | Wt 152.4 lb

## 2012-04-22 DIAGNOSIS — C50212 Malignant neoplasm of upper-inner quadrant of left female breast: Secondary | ICD-10-CM

## 2012-04-22 NOTE — Progress Notes (Signed)
Pt states over weekend she developed soreness/tenderness of left breast, nipple area. She has dry desquamation of areola, bright hyperpigmentation of left breast tx area. She is applying Radiaplex; advised she may need to apply antibiotic oint to nipple, will discuss w/dr today. Pt states wearing under wire sleep bra is comfortable for her. No desquamation under breast. She is fatigued but continues to work full time.

## 2012-04-22 NOTE — Progress Notes (Signed)
   Department of Radiation Oncology  Phone:  [phone] Fax:        [phone]  Earlier today the patient's electron setup was checked on the linear accelerator. This showed accurate placement compared to her planned field.

## 2012-04-22 NOTE — Progress Notes (Signed)
[facility]    Radiation Oncology [address] [place]     Maryln Gottron, M.D. [place], [place] [address]               Billie Lade, M.D., Ph.D. Phone: [phone]      Molli Hazard A. Kathrynn Running, M.D. Fax: [phone]      Radene Gunning, M.D., Ph.D.         Lurline Hare, M.D.         Grayland Jack, M.D Weekly Treatment Management Note  Name: Tina Parks     MRN: [id number]        CSN: [id number] Date: 04/22/2012      DOB: 1970-07-25  CC: Learta Codding    Status: Outpatient  Diagnosis: The encounter diagnosis was Cancer of upper-inner quadrant of female breast, left.  Current Dose: 44.72 Gy  Current Fraction: 17  Planned Dose: 52.72 Gy  Narrative: [name] was seen today for weekly treatment management. The chart was checked and port films  were reviewed. She has developed some soreness in the left breast and nipple region. 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  Review of patient's allergies indicates no known allergies.  Current Outpatient Prescriptions  Medication Sig Dispense Refill  . ALPRAZolam (XANAX) 1 MG tablet Take 1 mg by mouth at bedtime as needed. sleep      . hyaluronate sodium (RADIAPLEXRX) GEL Apply topically 2 (two) times daily.      . montelukast (SINGULAIR) 10 MG tablet Take 10 mg by mouth daily.       . non-metallic deodorant Thornton Papas) MISC Apply 1 application topically daily as needed.      . sertraline (ZOLOFT) 50 MG tablet Take 1 tablet (50 mg total) by mouth daily.  90 tablet  4  . tamoxifen (NOLVADEX) 20 MG tablet Take 1 tablet (20 mg total) by mouth daily.  30 tablet  3  . valACYclovir (VALTREX) 1000 MG tablet Take 1,000 mg by mouth as needed. For fever blister       No current facility-administered medications for this encounter.   Labs:  Lab Results  Component Value Date   WBC 5.3 03/14/2012   HGB 12.5 03/14/2012   HCT 38.2 03/14/2012   MCV 89.7 03/14/2012   PLT 174 03/14/2012    Lab Results  Component Value Date   CREATININE 0.9 03/14/2012   BUN 12.3 03/14/2012   NA 140 03/14/2012   K 3.8 03/14/2012   CL 107 03/14/2012   CO2 28 03/14/2012   Lab Results  Component Value Date   ALT 20 03/14/2012   AST 14 03/14/2012   BILITOT 0.26 03/14/2012    Physical Examination:  weight is 152 lb 6.4 oz (69.128 kg). Her oral temperature is 98 F (36.7 C). Her blood pressure is 109/68 and her pulse is 64. Her respiration is 20.    Wt Readings from Last 3 Encounters:  04/22/12 152 lb 6.4 oz (69.128 kg)  04/15/12 153 lb 12.8 oz (69.763 kg)  04/08/12 148 lb 14.4 oz (67.541 kg)    The left breast area shows a brisk reaction without any moist desquamation. Lungs - Normal respiratory effort, chest expands symmetrically. Lungs are clear to auscultation, no crackles or wheezes.  Heart has regular rhythm and rate  Abdomen is soft and non tender with normal bowel sounds  Assessment:  Patient tolerating treatments well  Plan: Continue treatment  per original radiation prescription

## 2012-04-23 ENCOUNTER — Ambulatory Visit
Admission: RE | Admit: 2012-04-23 | Discharge: 2012-04-23 | Disposition: A | Discharge status: Home / Self Care | Payer: 59 | Source: Ambulatory Visit | Attending: Radiation Oncology | Admitting: Radiation Oncology

## 2012-04-24 ENCOUNTER — Ambulatory Visit
Admission: RE | Admit: 2012-04-24 | Discharge: 2012-04-24 | Disposition: A | Discharge status: Home / Self Care | Payer: 59 | Source: Ambulatory Visit | Attending: Radiation Oncology | Admitting: Radiation Oncology

## 2012-04-25 ENCOUNTER — Ambulatory Visit
Admission: RE | Admit: 2012-04-25 | Discharge: 2012-04-25 | Disposition: A | Discharge status: Home / Self Care | Payer: 59 | Source: Ambulatory Visit | Attending: Radiation Oncology | Admitting: Radiation Oncology

## 2012-04-28 ENCOUNTER — Ambulatory Visit
Admission: RE | Admit: 2012-04-28 | Discharge: 2012-04-28 | Disposition: A | Discharge status: Home / Self Care | Payer: 59 | Source: Ambulatory Visit | Attending: Radiation Oncology | Admitting: Radiation Oncology

## 2012-04-28 ENCOUNTER — Encounter: Payer: Self-pay | Admitting: Radiation Oncology

## 2012-04-28 VITALS — BP 105/65 | HR 92 | Temp 97.9°F | Resp 20 | Wt 151.8 lb

## 2012-04-28 DIAGNOSIS — C50212 Malignant neoplasm of upper-inner quadrant of left female breast: Secondary | ICD-10-CM

## 2012-04-28 NOTE — Progress Notes (Signed)
[facility]    Radiation Oncology [address] [place]     Maryln Gottron, M.D. [place], [place] [address]               Billie Lade, M.D., Ph.D. Phone: [phone]      Molli Hazard A. Kathrynn Running, M.D. Fax: [phone]      Radene Gunning, M.D., Ph.D.         Lurline Hare, M.D.         Grayland Jack, M.D Weekly Treatment Management Note  Name: Tina Parks     MRN: [id number]        CSN: [id number] Date: 04/28/2012      DOB: May 24, 1970  CC: Learta Codding    Status: Outpatient  Diagnosis: The encounter diagnosis was Cancer of upper-inner quadrant of female breast, left.  Current Dose: 52.7 Gy  Current Fraction: 21  Planned Dose: 52.7 Gy  Narrative: [name] was seen today for weekly treatment management. The chart was checked and port films/electron fiields  were reviewed. She is happy to complete her radiation therapy. She does have some soreness  in the nipple areolar complex area. She denies any drainage from the breast area. She continues to work her full-time schedule.  Review of patient's allergies indicates no known allergies.  Current Outpatient Prescriptions  Medication Sig Dispense Refill  . ALPRAZolam (XANAX) 1 MG tablet Take 1 mg by mouth at bedtime as needed. sleep      . hyaluronate sodium (RADIAPLEXRX) GEL Apply topically 2 (two) times daily.      . montelukast (SINGULAIR) 10 MG tablet Take 10 mg by mouth daily.       . non-metallic deodorant Thornton Papas) MISC Apply 1 application topically daily as needed.      . sertraline (ZOLOFT) 50 MG tablet Take 1 tablet (50 mg total) by mouth daily.  90 tablet  4  . tamoxifen (NOLVADEX) 20 MG tablet Take 1 tablet (20 mg total) by mouth daily.  30 tablet  3  . valACYclovir (VALTREX) 1000 MG tablet Take 1,000 mg by mouth as needed. For fever blister       No current facility-administered medications for this encounter.      Physical Examination:  weight is 151 lb 12.8 oz (68.856  kg). Her oral temperature is 97.9 F (36.6 C). Her blood pressure is 105/65 and her pulse is 92. Her respiration is 20.    Wt Readings from Last 3 Encounters:  04/28/12 151 lb 12.8 oz (68.856 kg)  04/22/12 152 lb 6.4 oz (69.128 kg)  04/15/12 153 lb 12.8 oz (69.763 kg)    The left breast area shows a brisk reaction but no moist desquamation is appreciated.     Assessment:  Patient tolerated treatments relatively well.  Plan: Followup with Dr. Michell Heinrich in one month

## 2012-04-28 NOTE — Progress Notes (Addendum)
Weekly RAdTX LEFT BREAST, 21 txs completed , ERYTHEMA, SKIN DERMATITISON OUTSIDE LEFT CHEST NEAR STERNUM, ITCHIY,, nipple area sore and, maserated, stated no drainage, eating well, and drinking well, gave 1 month f/u appt card, using radiaplex gel bid  1:24 PM

## 2012-04-30 ENCOUNTER — Encounter: Payer: Self-pay | Admitting: Oncology

## 2012-04-30 ENCOUNTER — Telehealth: Payer: Self-pay | Admitting: Medical Oncology

## 2012-04-30 ENCOUNTER — Encounter: Payer: Self-pay | Admitting: Genetic Counselor

## 2012-04-30 ENCOUNTER — Telehealth: Payer: Self-pay | Admitting: *Deleted

## 2012-04-30 DIAGNOSIS — C50212 Malignant neoplasm of upper-inner quadrant of left female breast: Secondary | ICD-10-CM

## 2012-04-30 NOTE — Telephone Encounter (Signed)
Lm on vm informing pt that KK will be out of the office on 05/15/12.Marland Kitchengv new appt d/t for 08/12/15 @11 .

## 2012-04-30 NOTE — Telephone Encounter (Signed)
Called pt to f/u pt's email sent 04/02 regarding when to start tamoxifen and the fact that she is on zoloft, does she need to switch this medication. Pt completed radtiation tx 03/31.  Reviewed pt's questions with MD. Nadine Counts. Per Dr Welton Flakes patient needs to come in for office appt to see NP, pt to start taking zoloft every other day only and to not start tamoxifen until pt seen for f/u appt. Informed pt she will be sched for labs/NP appt 04/21 @ 2:30 and asked pt to return call to confirm she has rec'vd this message.

## 2012-05-01 ENCOUNTER — Telehealth: Payer: Self-pay | Admitting: Medical Oncology

## 2012-05-01 ENCOUNTER — Encounter: Payer: Self-pay | Admitting: Oncology

## 2012-05-01 NOTE — Telephone Encounter (Signed)
Called pt x2 and LVMOM to confirm with pt appts and instructions regarding tamoxifen and zoloft. Asked patient to return call.

## 2012-05-01 NOTE — Progress Notes (Signed)
  Radiation Oncology         [phone] ________________________________  Name: Tina Parks MRN: [id number]  Date: 04/28/2012  DOB: [birth year]  End of Treatment Note  Diagnosis:   T1aN0 Invasive Ductal Carcinoma   Indication for treatment:  Curative  Radiation treatment dates:   03/31/2012-04/28/2012  Site/dose:    Left breast / 42.72 Wallace Cullens @ 1.8 Wallace Cullens per fraction x 25 fractions Left breast boost / 10 Gray at 2 Gray per fraction x 5 fractions  Beams/energy:  Opposed Tangents / 6 MV photons En face / 9 MeV electrons  Narrative: The patient tolerated radiation treatment relatively well.   She was able to work during treatment. She has some skin irritation and fatigue.  Plan: The patient has completed radiation treatment. The patient will return to radiation oncology clinic for routine followup in one month. I advised them to call or return sooner if they have any questions or concerns related to their recovery or treatment.  ------------------------------------------------  Lurline Hare, MD

## 2012-05-01 NOTE — Progress Notes (Signed)
  Radiation Oncology         [phone] ________________________________  Name: Tina Parks MRN: [id number]  Date: 03/31/2012  DOB: 1970-04-02  Simulation Verification Note  Status: outpatient  NARRATIVE: The patient was brought to the treatment unit and placed in the planned treatment position. The clinical setup was verified. Then port films were obtained and uploaded to the radiation oncology medical record software.  The treatment beams were carefully compared against the planned radiation fields. The position location and shape of the radiation fields was reviewed. The targeted volume of tissue appears appropriately covered by the radiation beams. Organs at risk appear to be excluded as planned.  Based on my personal review, I approved the simulation verification. The patient's treatment will proceed as planned.  ------------------------------------------------  Lurline Hare, MD

## 2012-05-02 ENCOUNTER — Telehealth: Payer: Self-pay | Admitting: Oncology

## 2012-05-06 ENCOUNTER — Other Ambulatory Visit: Payer: Self-pay | Admitting: Physician Assistant

## 2012-05-06 ENCOUNTER — Telehealth: Payer: Self-pay | Admitting: Physician Assistant

## 2012-05-06 MED ORDER — PREDNISONE 20 MG PO TABS: ORAL_TABLET | ORAL | Status: DC

## 2012-05-06 NOTE — Telephone Encounter (Signed)
Patient requests prednisone for pruritic rash that has spread from arms and torso to include both legs.

## 2012-05-16 ENCOUNTER — Ambulatory Visit: Payer: 59 | Admitting: Oncology

## 2012-05-19 ENCOUNTER — Encounter: Payer: Self-pay | Admitting: Adult Health

## 2012-05-19 ENCOUNTER — Ambulatory Visit (HOSPITAL_BASED_OUTPATIENT_CLINIC_OR_DEPARTMENT_OTHER): Payer: 59 | Admitting: Adult Health

## 2012-05-19 ENCOUNTER — Encounter: Payer: Self-pay | Admitting: Lab

## 2012-05-19 ENCOUNTER — Other Ambulatory Visit (HOSPITAL_BASED_OUTPATIENT_CLINIC_OR_DEPARTMENT_OTHER): Payer: 59 | Admitting: Lab

## 2012-05-19 VITALS — BP 109/72 | HR 89 | Temp 98.3°F | Resp 20 | Ht 66.0 in | Wt 153.7 lb

## 2012-05-19 DIAGNOSIS — C50212 Malignant neoplasm of upper-inner quadrant of left female breast: Secondary | ICD-10-CM

## 2012-05-19 DIAGNOSIS — Z170 Estrogen receptor positive status [ER+]: Secondary | ICD-10-CM

## 2012-05-19 DIAGNOSIS — F419 Anxiety disorder, unspecified: Secondary | ICD-10-CM

## 2012-05-19 DIAGNOSIS — C50219 Malignant neoplasm of upper-inner quadrant of unspecified female breast: Secondary | ICD-10-CM

## 2012-05-19 LAB — COMPREHENSIVE METABOLIC PANEL (CC13)
AST: 14 U/L (ref 5–34)
Albumin: 3.5 g/dL (ref 3.5–5.0)
Alkaline Phosphatase: 48 U/L (ref 40–150)
Glucose: 86 mg/dl (ref 70–99)
Potassium: 3.7 mEq/L (ref 3.5–5.1)
Sodium: 139 mEq/L (ref 136–145)
Total Protein: 6.3 g/dL — ABNORMAL LOW (ref 6.4–8.3)

## 2012-05-19 LAB — CBC WITH DIFFERENTIAL/PLATELET
Eosinophils Absolute: 0.2 10*3/uL (ref 0.0–0.5)
MCV: 90.2 fL (ref 79.5–101.0)
MONO#: 0.4 10*3/uL (ref 0.1–0.9)
MONO%: 6.8 % (ref 0.0–14.0)
NEUT#: 4.3 10*3/uL (ref 1.5–6.5)
RBC: 4.41 10*6/uL (ref 3.70–5.45)
RDW: 13 % (ref 11.2–14.5)
WBC: 6.1 10*3/uL (ref 3.9–10.3)
lymph#: 1.1 10*3/uL (ref 0.9–3.3)

## 2012-05-19 MED ORDER — VENLAFAXINE HCL ER 37.5 MG PO CP24: 37.5000 mg | ORAL_CAPSULE | Freq: Every day | ORAL | Status: DC

## 2012-05-19 NOTE — Progress Notes (Signed)
OFFICE PROGRESS NOTE  CC  [facility], PA-C [address] 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 Pierce Crane Dr. Lurline Hare and Dr. Cyndia Bent on 12/19/2011 with a new diagnosis of left-sided invasive ductal carcinoma stage I ER positive PR positive HER-2/neu negative. Recommendations for a left lumpectomy with sentinel lymph node biopsy. She underwent this on 02/21/2012. Her final pathology revealed a 0.3 cm invasive ductal carcinoma that was grade 1. No lymphovascular invasion margins were -2 sentinel nodes were negative for metastatic disease tumor was ER +100% PR +100% HER-2/neu negative with a Ki-67 3%.  #2 patient had genetic testing and counseling performed. She had a gene BX panel performed for breast and ovarian cancer panel. 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 She was also found to have a variant FAM175a, c.1139A>G (p.Asp380Gly).  #3 patient to proceed with radiation therapy adjuvantly with Dr. Lurline Hare from 3/4-3/31.  #4 Adjuvant antiestrogen therapy with tamoxifen 20 mg daily beginning 04/2012  CURRENT THERAPY: Tamoxifen daily  INTERVAL HISTORY: Tina Parks 42 y.o. female returns for followup visit  For her left breast cancer.  She is doing well today.  She has just completed 3 weeks of adjuvant radiation therapy.  She tolerated it very well.  She does have some erythema and mild tenderness at her breast, but otherwise it is doing well.  She has tapered off Zoloft due to the possibility of Zoloft decreasing the effectiveness of Tamoxifen.  She hasn't taken any Zoloft in 10 days.  She took the Zoloft for anxiety and depression, and has done well without it.  She otherwise is feeling well, and  denies fevers, chills, nausea, vomiting, constipation, diarrhea, or any further concerns.    MEDICAL HISTORY: Past Medical History  Diagnosis Date  . CIN I (cervical intraepithelial neoplasia I)   . Stress fracture 2011    Left Hip  . Depression   . Anxiety   . Sinusitis   . Bronchial spasm     Exercise induced  . Breast cancer 12/13/11    left, ER/PR +, Her 2 -  . Family history of anesthesia complication     significant PONV    ALLERGIES:  has No Known Allergies.  MEDICATIONS:  Current Outpatient Prescriptions  Medication Sig Dispense Refill  . ALPRAZolam (XANAX) 1 MG tablet Take 1 mg by mouth at bedtime as needed. sleep      . hyaluronate sodium (RADIAPLEXRX) GEL Apply topically 2 (two) times daily.      . montelukast (SINGULAIR) 10 MG tablet Take 10 mg by mouth daily.       . tamoxifen (NOLVADEX) 20 MG tablet Take 1 tablet (20 mg total) by mouth daily.  30 tablet  3  . valACYclovir (VALTREX) 1000 MG tablet Take 1,000 mg by mouth as needed. For fever blister      . venlafaxine XR (EFFEXOR-XR) 37.5 MG 24 hr capsule Take 1 capsule (37.5 mg total) by mouth daily.  30 capsule  2   No current facility-administered medications for this visit.    SURGICAL HISTORY:  Past Surgical History  Procedure Laterality Date  . Colposcopy    . Wisdom tooth extraction  1987    Sedation only  . Breast lumpectomy with needle  localization and axillary sentinel lymph node bx  02/21/2012    Procedure: BREAST LUMPECTOMY WITH NEEDLE LOCALIZATION AND AXILLARY SENTINEL LYMPH NODE BX;  Surgeon: Currie Paris, MD;  Location:  SURGERY CENTER;  Service: General;  Laterality: Left;  break in sterile field to initiate CPR    REVIEW OF SYSTEMS:   General: fatigue (-), night sweats (-), fever (-), pain (-) Lymph: palpable nodes (-) HEENT: vision changes (-), mucositis (-), gum bleeding (-), epistaxis (-) Cardiovascular: chest pain (-), palpitations (-) Pulmonary: shortness of breath (-),  dyspnea on exertion (-), cough (-), hemoptysis (-) GI:  Early satiety (-), melena (-), dysphagia (-), nausea/vomiting (-), diarrhea (-) GU: dysuria (-), hematuria (-), incontinence (-) Musculoskeletal: joint swelling (-), joint pain (-), back pain (-) Neuro: weakness (-), numbness (-), headache (-), confusion (-) Skin: Rash (-), lesions (-), dryness (-) Psych: depression (-), suicidal/homicidal ideation (-), feeling of hopelessness (-)   Health Maintenance  Mammogram: 11/2011 Colonoscopy: n/a Bone Density Scan: 2-3 years ago Pap Smear: 05/2011 Eye Exam: 03/2012 Vitamin D Level: 05/2011 Lipid Panel: 05/2011   PHYSICAL EXAMINATION: Blood pressure 109/72, pulse 89, temperature 98.3 F (36.8 C), temperature source Oral, resp. rate 20, height 5\' 6"  (1.676 m), weight 153 lb 11.2 oz (69.718 kg). Body mass index is 24.82 kg/(m^2). General: Patient is a well appearing female in no acute distress HEENT: PERRLA, sclerae anicteric no conjunctival pallor, MMM Neck: supple, no palpable adenopathy Lungs: clear to auscultation bilaterally, no wheezes, rhonchi, or rales Cardiovascular: regular rate rhythm, S1, S2, no murmurs, rubs or gallops Abdomen: Soft, non-tender, non-distended, normoactive bowel sounds, no HSM Extremities: warm and well perfused, no clubbing, cyanosis, or edema Skin: No rashes or lesions Neuro: Non-focal Breasts: ECOG PERFORMANCE STATUS: 0 - Asymptomatic    Right breast: No masses nipple discharge or skin changes. Left breast reveals well-healed excisional scar no masses nipple discharge skin changes.  LABORATORY DATA: Lab Results  Component Value Date   WBC 6.1 05/19/2012   HGB 13.2 05/19/2012   HCT 39.8 05/19/2012   MCV 90.2 05/19/2012   PLT 173 05/19/2012      Chemistry      Component Value Date/Time   NA 139 05/19/2012 1431   NA 140 02/21/2012 1250   K 3.7 05/19/2012 1431   K 4.1 02/21/2012 1250   CL 102 05/19/2012 1431   CL 104 02/21/2012 1250   CO2 28  05/19/2012 1431   CO2 26 02/21/2012 1250   BUN 10.0 05/19/2012 1431   BUN 14 02/21/2012 1250   CREATININE 0.8 05/19/2012 1431   CREATININE 0.71 02/21/2012 1250      Component Value Date/Time   CALCIUM 9.3 05/19/2012 1431   CALCIUM 8.5 02/21/2012 1250   ALKPHOS 48 05/19/2012 1431   ALKPHOS 34* 02/21/2012 1250   AST 14 05/19/2012 1431   AST 20 02/21/2012 1250   ALT 14 05/19/2012 1431   ALT 16 02/21/2012 1250   BILITOT 0.46 05/19/2012 1431   BILITOT 0.2* 02/21/2012 1250     ADDITIONAL INFORMATION: 1. CHROMOGENIC IN-SITU HYBRIDIZATION Interpretation HER-2/NEU BY CISH - NO AMPLIFICATION OF HER-2 DETECTED. THE RATIO OF HER-2: CEP 17 SIGNALS WAS 1.40. Reference range: Ratio: HER2:CEP17 < 1.8 - gene amplification not observed Ratio: HER2:CEP 17 1.8-2.2 - equivocal result Ratio: HER2:CEP17 > 2.2 - gene amplification observed Jimmy Picket MD Pathologist, Electronic Signature ( Signed 02/29/2012) FINAL DIAGNOSIS Diagnosis 1. Breast, lumpectomy, Left - INVASIVE GRADE I DUCTAL CARCINOMA, SPANNING 0.3 CM IN GREATEST DIMENSION -  LYMPH/VASCULAR INVASION IS NOT IDENTIFIED. - MARGINS ARE NEGATIVE. - SEE ONCOLOGY TEMPLATE 2. Lymph node, sentinel, biopsy, Left axillary #1 - ONE BENIGN LYMPH NODE WITH NO TUMOR SEEN (0/1). 3. Lymph node, sentinel, biopsy, Left axillary #2 - ONE BENIGN LYMPH NODE WITH NO TUMOR SEEN (0/1). Microscopic Comment 1. BREAST, INVASIVE TUMOR, WITH LYMPH NODE SAMPLING 1 of 3 FINAL for Deems, Crickett S ([id number]) Microscopic Comment(continued) Specimen, including laterality: Left partial breast. Procedure: Left breast lumpectomy. Grade: I. Tubule formation: 1. Nuclear pleomorphism: 1. Mitotic: 1. Tumor size (glass slide measurement): 0.3 cm. Margins: Invasive, distance to closest margin: 0.25 cm (posterior margin). Lymphovascular invasion: Not identified. Ductal carcinoma in situ: Not identified. Lobular neoplasia: Not identified. Tumor focality: Unifocal. Treatment  effect: N/A. Extent of tumor: Tumor confined to breast parenchyma. Lymph nodes: # examined: 2. Lymph nodes with metastasis: 0. Breast prognostic profile: Performed on previous case ([id number]) Estrogen receptor: 100%, positive. Progesterone receptor: 100%, positive. Her 2 neu: 1.52, no amplification. Ki-67: 3%, low. Non-neoplastic breast: Fibrocystic changes with calcification. TNM: pT1a, pN0, pMX. Comments: Due to the lobulated and bland appearance of the tumor, immunohistochemical stains are performed to prove that invasive carcinoma is present. In the focus suspicious for tumor, the proliferating area shows negativity for p63, calponin and smooth muscle myosin confirming the above diagnosis. Of note, there is very limited tumor on the left breast lumpectomy specimen with the largest focus measuring 0.3 cm. The previous needle core biopsy is re-reviewed which shows a focus of invasive ductal carcinoma spanning 0.4 cm. Regardless, the T stage is pT1a. A Her-2 neu by CISH will be repeated on the current tumor and reported in an addendum. (RH:kh 02-25-12) Zandra Abts MD Pathologist, Electronic Signature (Case signed 02/26/2012) Specimen Gross and  RADIOGRAPHIC STUDIES:  Nm Sentinel Node Inj-no Rpt (breast)  02/21/2012  CLINICAL DATA: left breast cancer   Sulfur colloid was injected intradermally by the nuclear medicine  technologist for breast cancer sentinel node localization.     US Breast Bx W Loc Dev 1st Lesion Img Bx Spec US Guide  02/21/2012  *RADIOLOGY REPORT*  Clinical Data:  left upper inner quadrant carcinoma  NEEDLE LOCALIZATION USING ULTRASOUND GUIDANCE AND SPECIMEN RADIOGRAPH  Comparison: Previous exams.  Patient presents for needle localization prior to lumpectomy.  I met with the patient and we discussed the procedure of needle localization including benefits and alternatives. We discussed the high likelihood of a successful procedure. We discussed the risks of the  procedure, including infection, bleeding, tissue injury, and further surgery. Informed, written consent was given.  Using ultrasound guidance, sterile technique, 2% lidocaine and a 5cm modified Kopans needle, the mass and clip were localized using a radial oblique approach.  The films are marked for Dr. Jamey Ripa.  Specimen radiograph is performed at , and confirms marker clip present in the tissue sample.  The specimen is marked for pathology.  IMPRESSION: Needle localization left breast.  No apparent complications.   Original Report Authenticated By: Esperanza Heir, M.D.     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 Final pathology revealing a 0.3 cm disease sentinel nodes were negative for metastatic disease. Postoperatively patient is doing well. She is clinical stage I.  #2 patient also has some variance on her genetic testing but most likely these are benign.  #3 she will need adjuvant radiation therapy and antiestrogen therapy. However she will proceed with the radiation  first. Her antiestrogen therapy will consist of tamoxifen since she is premenopausal.   PLAN:   #1 Restart Tamoxifen daily.  I also prescribed Effexor XR 37.5mg  daily.  She will think about starting this.  We will see her back in 3 months for f/u.    #2 We updated her health maintenance information above.  She is up to date.   All questions were answered. The patient knows to call the clinic with any problems, questions or concerns. We can certainly see the patient much sooner if necessary.  I spent 25 minutes counseling the patient face to face. The total time spent in the appointment was 30 minutes.  Cherie Ouch Lyn Hollingshead, NP Medical Oncology [facility] Phone: [phone] 05/19/2012, 3:30 PM

## 2012-05-19 NOTE — Patient Instructions (Addendum)
Doing well.  Okay to restart Tamoxifen.  I have prescribed Effexor XR for you to take.  Please call us if you have any questions or concerns.    Venlafaxine extended-release capsules What is this medicine? VENLAFAXINE (VEN la fax een) is used to treat depression, anxiety and panic disorder. This medicine may be used for other purposes; ask your health care provider or pharmacist if you have questions. What should I tell my health care provider before I take this medicine? They need to know if you have any of these conditions: -anorexia or weight loss -glaucoma -high blood pressure, heart problems or a recent heart attack -high cholesterol levels or receiving treatment for high cholesterol -kidney or liver disease -mania or bipolar disorder -seizures (convulsions) -suicidal thoughts or a previous suicide attempt -thyroid problems -an unusual or allergic reaction to venlafaxine, other medicines, foods, dyes, or preservatives -pregnant or trying to get pregnant -breast-feeding How should I use this medicine? Take this medicine by mouth with a full glass of water. Follow the directions on the prescription label. Do not cut, crush or chew this medicine. Take it with food. Try to take your medicine at about the same time each day. Do not take your medicine more often than directed. Do not stop taking this medicine suddenly except upon the advice of your doctor. Stopping this medicine too quickly may cause serious side effects or your condition may worsen. A special MedGuide will be given to you by the pharmacist with each prescription and refill. Be sure to read this information carefully each time. Talk to your pediatrician regarding the use of this medicine in children. Special care may be needed. Overdosage: If you think you have taken too much of this medicine contact a poison control center or emergency room at once. NOTE: This medicine is only for you. Do not share this medicine with  others. What if I miss a dose? If you miss a dose, take it as soon as you can. If it is almost time for your next dose, take only that dose. Do not take double or extra doses. What may interact with this medicine? Do not take this medicine with any of the following medications: -desvenlafaxine -duloxetine -linezolid -MAOIs like Azilect, Carbex, Eldepryl, Marplan, Nardil, and Parnate -medicines for weight control or appetite -methylene blue (injected into a vein) -nefazodone -procarbazine -tryptophan This medicine may also interact with the following medications: -amphetamine or dextroamphetamine -aspirin and aspirin-like medicines -cimetidine -clozapine -medicines for heart rhythm or blood pressure -medicines for migraine headache like almotriptan, eletriptan, frovatriptan, naratriptan, rizatriptan, sumatriptan, zolmitriptan -medicines that treat or prevent blood clots like warfarin, enoxaparin, and dalteparin -NSAIDS, medicines for pain and inflammation, like ibuprofen or naproxen -other medicines for depression, anxiety, or psychotic disturbances -ritonavir -St. John's wort -tramadol This list may not describe all possible interactions. Give your health care provider a list of all the medicines, herbs, non-prescription drugs, or dietary supplements you use. Also tell them if you smoke, drink alcohol, or use illegal drugs. Some items may interact with your medicine. What should I watch for while using this medicine? Tell your doctor if your symptoms do not get better or if they get worse. Visit your doctor or health care professional for regular checks on your progress. Because it may take several weeks to see the full effects of this medicine, it is important to continue your treatment as prescribed by your doctor. Patients and their families should watch out for new or worsening thoughts of suicide  or depression. Also watch out for sudden changes in feelings such as feeling anxious,  agitated, panicky, irritable, hostile, aggressive, impulsive, severely restless, overly excited and hyperactive, or not being able to sleep. If this happens, especially at the beginning of treatment or after a change in dose, call your health care professional. This medicine can cause an increase in blood pressure. Check with your doctor for instructions on monitoring your blood pressure while taking this medicine. You may get drowsy or dizzy. Do not drive, use machinery, or do anything that needs mental alertness until you know how this medicine affects you. Do not stand or sit up quickly, especially if you are an older patient. This reduces the risk of dizzy or fainting spells. Alcohol may interfere with the effect of this medicine. Avoid alcoholic drinks. Your mouth may get dry. Chewing sugarless gum, sucking hard candy and drinking plenty of water will help. Contact your doctor if the problem does not go away or is severe. What side effects may I notice from receiving this medicine? Side effects that you should report to your doctor or health care professional as soon as possible: -allergic reactions like skin rash, itching or hives, swelling of the face, lips, or tongue -breathing problems -changes in vision -hallucination, loss of contact with reality -seizures -suicidal thoughts or other mood changes -trouble passing urine or change in the amount of urine -unusual bleeding or bruising Side effects that usually do not require medical attention (report to your doctor or health care professional if they continue or are bothersome): -change in sex drive or performance -constipation -increased sweating -loss of appetite -nausea -tremors -weight loss This list may not describe all possible side effects. Call your doctor for medical advice about side effects. You may report side effects to FDA at 1-800-FDA-1088. Where should I keep my medicine? Keep out of the reach of children. Store at a  controlled temperature between 20 and 25 degrees C (68 degrees and 77 degrees F), in a dry place. Throw away any unused medicine after the expiration date. NOTE: This sheet is a summary. It may not cover all possible information. If you have questions about this medicine, talk to your doctor, pharmacist, or health care provider.  2013, [employer]. (06/01/2011 9:05:04 PM)

## 2012-05-21 ENCOUNTER — Other Ambulatory Visit: Payer: Self-pay | Admitting: Oncology

## 2012-05-21 ENCOUNTER — Other Ambulatory Visit: Payer: Self-pay | Admitting: Physician Assistant

## 2012-05-21 DIAGNOSIS — Z853 Personal history of malignant neoplasm of breast: Secondary | ICD-10-CM

## 2012-05-26 ENCOUNTER — Encounter: Payer: Self-pay | Admitting: Radiation Oncology

## 2012-05-26 DIAGNOSIS — Z923 Personal history of irradiation: Secondary | ICD-10-CM | POA: Insufficient documentation

## 2012-05-29 ENCOUNTER — Encounter: Payer: Self-pay | Admitting: Radiation Oncology

## 2012-05-29 ENCOUNTER — Ambulatory Visit
Admission: RE | Admit: 2012-05-29 | Discharge: 2012-05-29 | Disposition: A | Discharge status: Home / Self Care | Payer: 59 | Source: Ambulatory Visit | Attending: Radiation Oncology | Admitting: Radiation Oncology

## 2012-05-29 VITALS — BP 109/65 | HR 80 | Temp 98.6°F | Resp 20 | Wt 153.0 lb

## 2012-05-29 DIAGNOSIS — C50212 Malignant neoplasm of upper-inner quadrant of left female breast: Secondary | ICD-10-CM

## 2012-05-29 NOTE — Progress Notes (Signed)
   Department of Radiation Oncology  Phone:  [phone] Fax:        [phone]   Name: Tina Parks MRN: [id number]  DOB: Nov 14, 1970  Date: 05/29/2012  Follow Up Visit Note  Diagnosis: [id number] Left breast cancer  Summary and Interval since last radiation: One month post 52.72 Gy completed 04/28/12  Interval History: Tina Parks presents today for routine followup.  He is feeling well and doing well. She is tolerating her tamoxifen well. She continues Radiaplex.  Allergies: No Known Allergies  Medications:  Current Outpatient Prescriptions  Medication Sig Dispense Refill  . ALPRAZolam (XANAX) 1 MG tablet Take 1 mg by mouth at bedtime as needed. sleep      . hyaluronate sodium (RADIAPLEXRX) GEL Apply topically 2 (two) times daily.      . montelukast (SINGULAIR) 10 MG tablet Take 10 mg by mouth daily.       . tamoxifen (NOLVADEX) 20 MG tablet Take 1 tablet (20 mg total) by mouth daily.  30 tablet  3  . valACYclovir (VALTREX) 1000 MG tablet Take 1,000 mg by mouth as needed. For fever blister      . venlafaxine XR (EFFEXOR-XR) 37.5 MG 24 hr capsule Take 1 capsule (37.5 mg total) by mouth daily.  30 capsule  2   No current facility-administered medications for this encounter.    Physical Exam:  Filed Vitals:   05/29/12 1523  BP: 109/65  Pulse: 80  Temp: 98.6 F (37 C)  Resp: 20   her skin is well-healed. It her scar is well-healed. There is some slight pink associated with her left breast as compared to her right.  IMPRESSION: Tina Parks is a 42 y.o. female status post breast conservation with resolving acute effects of treatment  PLAN:  Looks good. I released her from followup with me. She has regular followup scheduled with medical oncology. I encouraged her to call with any questions in the interim. We discussed use of sun protection in the treated areas.    Lurline Hare, MD

## 2012-05-29 NOTE — Progress Notes (Signed)
Pt denies pain, fatigue, loss of appetite. She is applying Radiaplex to left breast tx area, slight pinkness remains. Taking Tamoxifen.

## 2012-05-30 ENCOUNTER — Encounter: Payer: Self-pay | Admitting: Physician Assistant

## 2012-05-30 ENCOUNTER — Ambulatory Visit (INDEPENDENT_AMBULATORY_CARE_PROVIDER_SITE_OTHER): Payer: 59 | Admitting: Gynecology

## 2012-05-30 ENCOUNTER — Encounter: Payer: Self-pay | Admitting: Gynecology

## 2012-05-30 VITALS — BP 124/78 | Ht 64.5 in | Wt 154.0 lb

## 2012-05-30 DIAGNOSIS — Z853 Personal history of malignant neoplasm of breast: Secondary | ICD-10-CM

## 2012-05-30 DIAGNOSIS — Z01419 Encounter for gynecological examination (general) (routine) without abnormal findings: Secondary | ICD-10-CM

## 2012-05-30 NOTE — Progress Notes (Signed)
Tina Parks [birth year] [id number]   History:    41 y.o.([place])  for annual gyn exam with no complaints today. 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 Recommendations for a left lumpectomy with sentinel lymph node biopsy. She underwent this on 02/21/2012. Her final pathology revealed a 0.3 cm invasive ductal carcinoma that was grade 1. No lymphovascular invasion margins were -2 sentinel nodes were negative for metastatic disease tumor was ER +100% PR +100% HER-2/neu negative with a Ki-67 3%.   patient had genetic testing and counseling performed. She had a gene BX panel performed for breast and ovarian cancer panel. 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 She was also found to have a variant FAM175a, c.1139A>G (p.Asp380Gly).  Patient completed radiation therapy and is now on tamoxifen. Patient is using condoms for contraception. She has a diagnostic mammogram followup later this month. Patient with past history of CIN-1 and 2005 subsequent Pap smears been normal. She had a bone density study done in 2011 secondary to stress fracture of the left hip and family history of osteoporosis her lowest Z. Score was at the right femoral neck with a value -1.2.  Patient's husband has had a vasectomy. She is not having normal menstrual cycles. There getting ready to start her on Effexor for  vasomotor symptoms. Patient states that her crackles one BRCA2 gene mutation was screen was negative  Patient has a history of aystolic arrest during her recent lumpectomy surgery. Patient does report a history of vasovagal-type symptoms. She was eventually followed by the cardiologist and had normal echocardiogram an EKG.this was attributed in conclusion by the cardiologist as to  a vasovagal episode and has had no sequelae.   Past medical history,surgical history, family history and social history were all reviewed and documented in the EPIC chart.  Gynecologic History Patient's last menstrual period was 05/03/2012. Contraception: vasectomy Last Pap: 2013. Results were: normal Last mammogram: see above. Results were: see above  Obstetric History OB History   Grav Para Term Preterm Abortions TAB SAB Ect Mult Living   1 1 1       1      # Outc Date GA Lbr Len/2nd Wgt Sex Del Anes PTL Lv   1 TRM              Obstetric Comments   G1, P1, NuvaRing 1990-2013       ROS: A ROS was performed and pertinent positives and negatives are included in the history.  GENERAL: No fevers or chills. HEENT: No change in vision, no earache, sore throat or sinus congestion. NECK: No pain or stiffness. CARDIOVASCULAR: No chest pain or pressure. No palpitations. PULMONARY: No shortness of breath, cough or wheeze. GASTROINTESTINAL: No abdominal pain, nausea, vomiting or diarrhea, melena or bright red blood per rectum. GENITOURINARY: No urinary frequency, urgency, hesitancy or dysuria. MUSCULOSKELETAL: No joint or muscle pain, no back pain, no recent trauma. DERMATOLOGIC: No rash, no itching, no lesions. ENDOCRINE: No polyuria, polydipsia, no heat or cold intolerance. No recent change in weight. HEMATOLOGICAL: No anemia or easy bruising or bleeding. NEUROLOGIC: No headache, seizures, numbness, tingling or weakness. PSYCHIATRIC: No depression, no loss of interest in normal activity or change in sleep pattern.     Exam: chaperone present  BP  124/78  Ht 5' 4.5" (1.638 m)  Wt 154 lb (69.854 kg)  BMI 26.04 kg/m2  LMP 05/03/2012  Body mass index is 26.04 kg/(m^2).  General appearance : Well developed well nourished female. No acute distress HEENT: Neck supple, trachea midline, no carotid bruits, no thyroidmegaly Lungs: Clear to auscultation, no rhonchi or wheezes, or rib retractions   Heart: Regular rate and rhythm, no murmurs or gallops Breast:Examined in sitting and supine position were symmetrical in appearance, no palpable masses or tenderness,  no skin retraction, no nipple inversion, no nipple discharge, no skin discoloration, no axillary or supraclavicular lymphadenopathy Abdomen: no palpable masses or tenderness, no rebound or guarding Extremities: no edema or skin discoloration or tenderness  Pelvic:  Bartholin, Urethra, Skene Glands: Within normal limits             Vagina: No gross lesions or discharge  Cervix: No gross lesions or discharge  Uterus  anteverted, normal size, shape and consistency, non-tender and mobile  Adnexa  Without masses or tenderness  Anus and perineum  normal   Rectovaginal  normal sphincter tone without palpated masses or tenderness             Hemoccult not done     Assessment/Plan:  42 y.o. female for annual exam  With history of aT1 A. N0 invasive ductal carcinoma of the left breast on first year tamoxifen doing well. She will return to the office in one to 2 weeks for an ultrasound of her ovaries. We'll continue to screen her on a yearly basis because of her history of breast cancer. We discussed importance of monthly supple examination. No Pap smear done today. New screening guidelines discussed. We discussed importance of calcium vitamin D for osteoporosis prevention. We'll obtain a bone density study next year.     Ok Edwards MD, 12:40 PM 05/30/2012

## 2012-05-30 NOTE — Patient Instructions (Addendum)

## 2012-06-02 ENCOUNTER — Encounter: Payer: Self-pay | Admitting: Obstetrics and Gynecology

## 2012-06-03 ENCOUNTER — Encounter (INDEPENDENT_AMBULATORY_CARE_PROVIDER_SITE_OTHER): Payer: Self-pay | Admitting: Surgery

## 2012-06-03 ENCOUNTER — Ambulatory Visit (INDEPENDENT_AMBULATORY_CARE_PROVIDER_SITE_OTHER): Payer: Commercial Managed Care - PPO | Admitting: Surgery

## 2012-06-03 VITALS — BP 104/62 | HR 99 | Temp 96.6°F | Ht 66.0 in | Wt 155.8 lb

## 2012-06-03 DIAGNOSIS — C50219 Malignant neoplasm of upper-inner quadrant of unspecified female breast: Secondary | ICD-10-CM

## 2012-06-03 DIAGNOSIS — C50212 Malignant neoplasm of upper-inner quadrant of left female breast: Secondary | ICD-10-CM

## 2012-06-03 NOTE — Progress Notes (Signed)
NAME: Tina Parks       DOB: Jul 03, 1970           DATE: 06/03/2012       ZOX:[id number]  CC:  Chief Complaint  Patient presents with  . Follow-up    reck Lt br    HPI: this patient comes in for office visit about a month after completion of her radiation therapy for her stage I left breast cancer upper inner quadrant. She overall tolerated the therapy well.  EXAM: Vital signs: BP 104/62  Pulse 99  Temp(Src) 96.6 F (35.9 C) (Temporal)  Ht 5\' 6"  (1.676 m)  Wt 155 lb 12.8 oz (70.67 kg)  BMI 25.16 kg/m2  SpO2 97%  LMP 05/03/2012  General: Patient alert, oriented, NAD  Left breast shows some very mild redness secondary to radiation. The lumpectomy scar is somewhat thickened for the length of the scar but the underlying breast tissue appears normal. I think overall this is an excellent cosmetic result from her radiation. IMP: doing well status post lumpectomy followed by radiation therapy  PLAN: I will see her back in about 5 months which will be 6 months from the completion of her radiation  Leonna Schlee J 06/03/2012

## 2012-06-03 NOTE — Patient Instructions (Signed)
See me again in five months, sooner if any problems or concerns

## 2012-06-10 ENCOUNTER — Encounter: Payer: Self-pay | Admitting: Oncology

## 2012-06-17 ENCOUNTER — Other Ambulatory Visit: Payer: Self-pay | Admitting: Emergency Medicine

## 2012-06-17 ENCOUNTER — Telehealth: Payer: Self-pay | Admitting: Oncology

## 2012-06-17 NOTE — Telephone Encounter (Signed)
, °

## 2012-06-18 ENCOUNTER — Other Ambulatory Visit: Payer: Self-pay | Admitting: Gynecology

## 2012-06-18 ENCOUNTER — Encounter: Payer: Self-pay | Admitting: Radiation Oncology

## 2012-06-18 ENCOUNTER — Ambulatory Visit (INDEPENDENT_AMBULATORY_CARE_PROVIDER_SITE_OTHER): Payer: 59 | Admitting: Gynecology

## 2012-06-18 ENCOUNTER — Encounter: Payer: Self-pay | Admitting: Gynecology

## 2012-06-18 ENCOUNTER — Ambulatory Visit (INDEPENDENT_AMBULATORY_CARE_PROVIDER_SITE_OTHER): Payer: 59

## 2012-06-18 ENCOUNTER — Telehealth: Payer: Self-pay | Admitting: *Deleted

## 2012-06-18 VITALS — BP 102/68

## 2012-06-18 DIAGNOSIS — N83209 Unspecified ovarian cyst, unspecified side: Secondary | ICD-10-CM

## 2012-06-18 DIAGNOSIS — Z853 Personal history of malignant neoplasm of breast: Secondary | ICD-10-CM

## 2012-06-18 DIAGNOSIS — N838 Other noninflammatory disorders of ovary, fallopian tube and broad ligament: Secondary | ICD-10-CM

## 2012-06-18 DIAGNOSIS — R9389 Abnormal findings on diagnostic imaging of other specified body structures: Secondary | ICD-10-CM

## 2012-06-18 DIAGNOSIS — N949 Unspecified condition associated with female genital organs and menstrual cycle: Secondary | ICD-10-CM

## 2012-06-18 DIAGNOSIS — D3912 Neoplasm of uncertain behavior of left ovary: Secondary | ICD-10-CM

## 2012-06-18 DIAGNOSIS — R102 Pelvic and perineal pain: Secondary | ICD-10-CM

## 2012-06-18 DIAGNOSIS — N83202 Unspecified ovarian cyst, left side: Secondary | ICD-10-CM

## 2012-06-18 DIAGNOSIS — D3910 Neoplasm of uncertain behavior of unspecified ovary: Secondary | ICD-10-CM

## 2012-06-18 DIAGNOSIS — N839 Noninflammatory disorder of ovary, fallopian tube and broad ligament, unspecified: Secondary | ICD-10-CM

## 2012-06-18 NOTE — Progress Notes (Signed)
Patient presented to the office today to discuss her ultrasound. Patient with history of aT1 A. N0 invasive ductal carcinoma of the left breast on first year tamoxifen doing well. She has informed me that her BRCA1 and BRCA2 were negative for the most common mutation. See last office note for details.  Ultrasound today: Uterus measured 8.7 x 5.3 x 4.3 cm with endometrial stripe 11.2 mm patient's last menstrual period was 05/31/2012. She has an anteverted arcuate uterus. Right ovary normal. Left ovary with thick wall cyst measuring 2.1 x 1.8 cm with positive color flow in the periphery, a thin walled cyst noted was noted measuring 2.6 x 1.9 cm with 2 thin septums and a solid focal area in the wall of this cyst measures 7 x 4 mm with color flow noted. Resistant index was 0.36. There was no fluid in the cul-de-sac  Assessment for/plan: Patient with past history of breast cancer on tamoxifen. Left ovarian cysts with with possible small blood clots in the wall of one of the cyst with a septum is. This is more than likely a benign cyst but nevertheless we will do a CA 125 ovarian cancer tumor marker. Patient fully aware of its limitations. She'll return back to the office in 3 months for followup ultrasound.

## 2012-06-18 NOTE — Telephone Encounter (Signed)
Spoke w/pt re: My Chart request for records to be submitted to [facility],  Transferred her to Baxter Flattery, medical records secretary.

## 2012-06-18 NOTE — Patient Instructions (Addendum)
Ovarian Cyst  The ovaries are small organs that are on each side of the uterus. The ovaries are the organs that produce the female hormones, estrogen and progesterone. An ovarian cyst is a sac filled with fluid that can vary in its size. It is normal for a small cyst to form in women who are in the childbearing age and who have menstrual periods. This type of cyst is called a follicle cyst that becomes an ovulation cyst (corpus luteum cyst) after it produces the women's egg. It later goes away on its own if the woman does not become pregnant. There are other kinds of ovarian cysts that may cause problems and may need to be treated. The most serious problem is a cyst with cancer. It should be noted that menopausal women who have an ovarian cyst are at a higher risk of it being a cancer cyst. They should be evaluated very quickly, thoroughly and followed closely. This is especially true in menopausal women because of the high rate of ovarian cancer in women in menopause.  CAUSES AND TYPES OF OVARIAN CYSTS:   FUNCTIONAL CYST: The follicle/corpus luteum cyst is a functional cyst that occurs every month during ovulation with the menstrual cycle. They go away with the next menstrual cycle if the woman does not get pregnant. Usually, there are no symptoms with a functional cyst.   ENDOMETRIOMA CYST: This cyst develops from the lining of the uterus tissue. This cyst gets in or on the ovary. It grows every month from the bleeding during the menstrual period. It is also called a "chocolate cyst" because it becomes filled with blood that turns brown. This cyst can cause pain in the lower abdomen during intercourse and with your menstrual period.   CYSTADENOMA CYST: This cyst develops from the cells on the outside of the ovary. They usually are not cancerous. They can get very big and cause lower abdomen pain and pain with intercourse. This type of cyst can twist on itself, cut off its blood supply and cause severe pain. It  also can easily rupture and cause a lot of pain.   DERMOID CYST: This type of cyst is sometimes found in both ovaries. They are found to have different kinds of body tissue in the cyst. The tissue includes skin, teeth, hair, and/or cartilage. They usually do not have symptoms unless they get very big. Dermoid cysts are rarely cancerous.   POLYCYSTIC OVARY: This is a rare condition with hormone problems that produces many small cysts on both ovaries. The cysts are follicle-like cysts that never produce an egg and become a corpus luteum. It can cause an increase in body weight, infertility, acne, increase in body and facial hair and lack of menstrual periods or rare menstrual periods. Many women with this problem develop type 2 diabetes. The exact cause of this problem is unknown. A polycystic ovary is rarely cancerous.   THECA LUTEIN CYST: Occurs when too much hormone (human chorionic gonadotropin) is produced and over-stimulates the ovaries to produce an egg. They are frequently seen when doctors stimulate the ovaries for invitro-fertilization (test tube babies).   LUTEOMA CYST: This cyst is seen during pregnancy. Rarely it can cause an obstruction to the birth canal during labor and delivery. They usually go away after delivery.  SYMPTOMS    Pelvic pain or pressure.   Pain during sexual intercourse.   Increasing girth (swelling) of the abdomen.   Abnormal menstrual periods.   Increasing pain with menstrual periods.     You stop having menstrual periods and you are not pregnant.  DIAGNOSIS   The diagnosis can be made during:   Routine or annual pelvic examination (common).   Ultrasound.   X-ray of the pelvis.   CT Scan.   MRI.   Blood tests.  TREATMENT    Treatment may only be to follow the cyst monthly for 2 to 3 months with your caregiver. Many go away on their own, especially functional cysts.   May be aspirated (drained) with a long needle with ultrasound, or by laparoscopy (inserting a tube into  the pelvis through a small incision).   The whole cyst can be removed by laparoscopy.   Sometimes the cyst may need to be removed through an incision in the lower abdomen.   Hormone treatment is sometimes used to help dissolve certain cysts.   Birth control pills are sometimes used to help dissolve certain cysts.  HOME CARE INSTRUCTIONS   Follow your caregiver's advice regarding:   Medicine.   Follow up visits to evaluate and treat the cyst.   You may need to come back or make an appointment with another caregiver, to find the exact cause of your cyst, if your caregiver is not a gynecologist.   Get your yearly and recommended pelvic examinations and Pap tests.   Let your caregiver know if you have had an ovarian cyst in the past.  SEEK MEDICAL CARE IF:    Your periods are late, irregular, they stop, or are painful.   Your stomach (abdomen) or pelvic pain does not go away.   Your stomach becomes larger or swollen.   You have pressure on your bladder or trouble emptying your bladder completely.   You have painful sexual intercourse.   You have feelings of fullness, pressure, or discomfort in your stomach.   You lose weight for no apparent reason.   You feel generally ill.   You become constipated.   You lose your appetite.   You develop acne.   You have an increase in body and facial hair.   You are gaining weight, without changing your exercise and eating habits.   You think you are pregnant.  SEEK IMMEDIATE MEDICAL CARE IF:    You have increasing abdominal pain.   You feel sick to your stomach (nausea) and/or vomit.   You develop a fever that comes on suddenly.   You develop abdominal pain during a bowel movement.   Your menstrual periods become heavier than usual.  Document Released: 01/15/2005 Document Revised: 04/09/2011 Document Reviewed: 11/18/2008  [employer] Patient Information 2014 [employer]

## 2012-06-24 ENCOUNTER — Encounter: Payer: Self-pay | Admitting: Radiation Oncology

## 2012-06-24 ENCOUNTER — Ambulatory Visit
Admission: RE | Admit: 2012-06-24 | Discharge: 2012-06-24 | Disposition: A | Discharge status: Home / Self Care | Payer: 59 | Source: Ambulatory Visit | Attending: Physician Assistant | Admitting: Physician Assistant

## 2012-06-24 DIAGNOSIS — Z853 Personal history of malignant neoplasm of breast: Secondary | ICD-10-CM

## 2012-07-17 ENCOUNTER — Encounter: Payer: Self-pay | Admitting: Oncology

## 2012-07-17 ENCOUNTER — Other Ambulatory Visit: Payer: Self-pay | Admitting: *Deleted

## 2012-07-17 DIAGNOSIS — C50919 Malignant neoplasm of unspecified site of unspecified female breast: Secondary | ICD-10-CM

## 2012-07-17 MED ORDER — TAMOXIFEN CITRATE 20 MG PO TABS: 20.0000 mg | ORAL_TABLET | Freq: Every day | ORAL | Status: DC

## 2012-07-22 ENCOUNTER — Other Ambulatory Visit: Payer: Self-pay | Admitting: Oncology

## 2012-08-11 ENCOUNTER — Ambulatory Visit: Payer: 59 | Admitting: Oncology

## 2012-08-18 ENCOUNTER — Encounter: Payer: Self-pay | Admitting: Oncology

## 2012-08-18 ENCOUNTER — Ambulatory Visit (HOSPITAL_BASED_OUTPATIENT_CLINIC_OR_DEPARTMENT_OTHER): Payer: 59 | Admitting: Oncology

## 2012-08-18 ENCOUNTER — Telehealth: Payer: Self-pay | Admitting: *Deleted

## 2012-08-18 VITALS — BP 117/76 | HR 93 | Temp 98.6°F | Resp 20 | Ht 66.0 in | Wt 154.4 lb

## 2012-08-18 DIAGNOSIS — N959 Unspecified menopausal and perimenopausal disorder: Secondary | ICD-10-CM

## 2012-08-18 DIAGNOSIS — Z170 Estrogen receptor positive status [ER+]: Secondary | ICD-10-CM

## 2012-08-18 DIAGNOSIS — F411 Generalized anxiety disorder: Secondary | ICD-10-CM

## 2012-08-18 DIAGNOSIS — C50212 Malignant neoplasm of upper-inner quadrant of left female breast: Secondary | ICD-10-CM

## 2012-08-18 DIAGNOSIS — C50219 Malignant neoplasm of upper-inner quadrant of unspecified female breast: Secondary | ICD-10-CM

## 2012-08-18 NOTE — Progress Notes (Signed)
OFFICE PROGRESS NOTE  CC  [facility], [facility]-C [address] 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 Pierce Crane Dr. Lurline Hare and Dr. Cyndia Bent on 12/19/2011 with a new diagnosis of left-sided invasive ductal carcinoma stage I ER positive PR positive HER-2/neu negative. Recommendations for a left lumpectomy with sentinel lymph node biopsy. She underwent this on 02/21/2012. Her final pathology revealed a 0.3 cm invasive ductal carcinoma that was grade 1. No lymphovascular invasion margins were -2 sentinel nodes were negative for metastatic disease tumor was ER +100% PR +100% HER-2/neu negative with a Ki-67 3%.  #2 patient had genetic testing and counseling performed. She had a gene BX panel performed for breast and ovarian cancer panel. 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 She was also found to have a variant FAM175a, c.1139A>G (p.Asp380Gly).  #3 patient to proceed with radiation therapy adjuvantly with Dr. Lurline Hare from 3/4-3/31.  #4 Adjuvant antiestrogen therapy with tamoxifen 20 mg daily beginning 04/2012  CURRENT THERAPY: Tamoxifen daily  INTERVAL HISTORY: Tina Parks 42 y.o. female returns for followup visit  For her left breast cancer.  She is doing well today.  She is now on tamoxifen as well as Effexor. The hot flashes have significantly diminished since being on the Effexor. She has not had any headaches double vision blurring of vision no shortness of breath chest pains no cramping in her legs no vaginal bleeding or discharge. Remainder of the 10 point review of systems is negative.  MEDICAL HISTORY: Past Medical History  Diagnosis Date  . CIN I (cervical intraepithelial neoplasia I)   . Stress  fracture 2011    Left Hip  . Depression   . Anxiety   . Sinusitis   . Bronchial spasm     Exercise induced  . Family history of anesthesia complication     significant PONV  . Hx of radiation therapy 03/31/12 - 04/28/12    left breast  . Breast cancer 12/13/11    left, ER/PR +, Her 2 -, treated with radiation    ALLERGIES:  has No Known Allergies.  MEDICATIONS:  Current Outpatient Prescriptions  Medication Sig Dispense Refill  . ALPRAZolam (XANAX) 1 MG tablet Take 1 mg by mouth at bedtime as needed. sleep      . montelukast (SINGULAIR) 10 MG tablet Take 10 mg by mouth daily.       . tamoxifen (NOLVADEX) 20 MG tablet Take 1 tablet (20 mg total) by mouth daily.  90 tablet  0  . valACYclovir (VALTREX) 1000 MG tablet Take 1,000 mg by mouth as needed. For fever blister      . venlafaxine XR (EFFEXOR-XR) 37.5 MG 24 hr capsule Take 1 capsule (37.5 mg total) by mouth daily.  30 capsule  2   No current facility-administered medications for this visit.    SURGICAL HISTORY:  Past Surgical History  Procedure Laterality Date  . Colposcopy    . Wisdom tooth extraction  1987    Sedation only  . Breast lumpectomy with needle localization and axillary sentinel lymph node bx  02/21/2012    Procedure: BREAST LUMPECTOMY WITH NEEDLE LOCALIZATION AND AXILLARY SENTINEL LYMPH NODE BX;  Surgeon: Currie Paris, MD;  Location: [facility];  Service: General;  Laterality: Left;  break in sterile field to initiate CPR  . Breast surgery  2014    left lumpectomy- lymphnode sampling .Marland Kitchen    REVIEW OF SYSTEMS:   General: fatigue (-), night sweats (-), fever (-), pain (-) Lymph: palpable nodes (-) HEENT: vision changes (-), mucositis (-), gum bleeding (-), epistaxis (-) Cardiovascular: chest pain (-), palpitations (-) Pulmonary: shortness of breath (-), dyspnea on exertion (-), cough (-), hemoptysis (-) GI:  Early satiety (-), melena (-), dysphagia (-), nausea/vomiting (-), diarrhea  (-) GU: dysuria (-), hematuria (-), incontinence (-) Musculoskeletal: joint swelling (-), joint pain (-), back pain (-) Neuro: weakness (-), numbness (-), headache (-), confusion (-) Skin: Rash (-), lesions (-), dryness (-) Psych: depression (-), suicidal/homicidal ideation (-), feeling of hopelessness (-)   Health Maintenance  Mammogram: 11/2011 Colonoscopy: n/a Bone Density Scan: 2-3 years ago Pap Smear: 05/2011 Eye Exam: 03/2012 Vitamin D Level: 05/2011 Lipid Panel: 05/2011   PHYSICAL EXAMINATION: Blood pressure 117/76, pulse 93, temperature 98.6 F (37 C), temperature source Oral, resp. rate 20, height 5\' 6"  (1.676 m), weight 154 lb 6.4 oz (70.035 kg). Body mass index is 24.93 kg/(m^2). General: Patient is a well appearing female in no acute distress HEENT: PERRLA, sclerae anicteric no conjunctival pallor, MMM Neck: supple, no palpable adenopathy Lungs: clear to auscultation bilaterally, no wheezes, rhonchi, or rales Cardiovascular: regular rate rhythm, S1, S2, no murmurs, rubs or gallops Abdomen: Soft, non-tender, non-distended, normoactive bowel sounds, no HSM Extremities: warm and well perfused, no clubbing, cyanosis, or edema Skin: No rashes or lesions Neuro: Non-focal Breasts: ECOG PERFORMANCE STATUS: 0 - Asymptomatic    Right breast: No masses nipple discharge or skin changes. Left breast reveals well-healed excisional scar no masses nipple discharge skin changes.  LABORATORY DATA: Lab Results  Component Value Date   WBC 6.1 05/19/2012   HGB 13.2 05/19/2012   HCT 39.8 05/19/2012   MCV 90.2 05/19/2012   PLT 173 05/19/2012      Chemistry      Component Value Date/Time   NA 139 05/19/2012 1431   NA 140 02/21/2012 1250   K 3.7 05/19/2012 1431   K 4.1 02/21/2012 1250   CL 102 05/19/2012 1431   CL 104 02/21/2012 1250   CO2 28 05/19/2012 1431   CO2 26 02/21/2012 1250   BUN 10.0 05/19/2012 1431   BUN 14 02/21/2012 1250   CREATININE 0.8 05/19/2012 1431   CREATININE 0.71  02/21/2012 1250      Component Value Date/Time   CALCIUM 9.3 05/19/2012 1431   CALCIUM 8.5 02/21/2012 1250   ALKPHOS 48 05/19/2012 1431   ALKPHOS 34* 02/21/2012 1250   AST 14 05/19/2012 1431   AST 20 02/21/2012 1250   ALT 14 05/19/2012 1431   ALT 16 02/21/2012 1250   BILITOT 0.46 05/19/2012 1431   BILITOT 0.2* 02/21/2012 1250     ADDITIONAL INFORMATION: 1. CHROMOGENIC IN-SITU HYBRIDIZATION Interpretation HER-2/NEU BY CISH - NO AMPLIFICATION OF HER-2 DETECTED. THE RATIO OF HER-2: CEP 17 SIGNALS WAS 1.40. Reference range: Ratio: HER2:CEP17 < 1.8 - gene amplification not observed Ratio: HER2:CEP 17 1.8-2.2 - equivocal result Ratio: HER2:CEP17 > 2.2 - gene amplification observed Jimmy Picket MD Pathologist, Electronic Signature ( Signed 02/29/2012) FINAL DIAGNOSIS Diagnosis 1. Breast, lumpectomy, Left - INVASIVE GRADE I DUCTAL CARCINOMA, SPANNING 0.3 CM IN GREATEST DIMENSION - LYMPH/VASCULAR INVASION IS NOT IDENTIFIED. - MARGINS ARE NEGATIVE. - SEE ONCOLOGY TEMPLATE 2. Lymph node, sentinel, biopsy, Left axillary #1 - ONE  BENIGN LYMPH NODE WITH NO TUMOR SEEN (0/1). 3. Lymph node, sentinel, biopsy, Left axillary #2 - ONE BENIGN LYMPH NODE WITH NO TUMOR SEEN (0/1). Microscopic Comment 1. BREAST, INVASIVE TUMOR, WITH LYMPH NODE SAMPLING 1 of 3 FINAL for Tina Parks, Tina Parks ([id number]) Microscopic Comment(continued) Specimen, including laterality: Left partial breast. Procedure: Left breast lumpectomy. Grade: I. Tubule formation: 1. Nuclear pleomorphism: 1. Mitotic: 1. Tumor size (glass slide measurement): 0.3 cm. Margins: Invasive, distance to closest margin: 0.25 cm (posterior margin). Lymphovascular invasion: Not identified. Ductal carcinoma in situ: Not identified. Lobular neoplasia: Not identified. Tumor focality: Unifocal. Treatment effect: N/A. Extent of tumor: Tumor confined to breast parenchyma. Lymph nodes: # examined: 2. Lymph nodes with metastasis: 0. Breast  prognostic profile: Performed on previous case ([id number]) Estrogen receptor: 100%, positive. Progesterone receptor: 100%, positive. Her 2 neu: 1.52, no amplification. Ki-67: 3%, low. Non-neoplastic breast: Fibrocystic changes with calcification. TNM: pT1a, pN0, pMX. Comments: Due to the lobulated and bland appearance of the tumor, immunohistochemical stains are performed to prove that invasive carcinoma is present. In the focus suspicious for tumor, the proliferating area shows negativity for p63, calponin and smooth muscle myosin confirming the above diagnosis. Of note, there is very limited tumor on the left breast lumpectomy specimen with the largest focus measuring 0.3 cm. The previous needle core biopsy is re-reviewed which shows a focus of invasive ductal carcinoma spanning 0.4 cm. Regardless, the T stage is pT1a. A Her-2 neu by CISH will be repeated on the current tumor and reported in an addendum. (RH:kh 02-25-12) Zandra Abts MD Pathologist, Electronic Signature (Case signed 02/26/2012) Specimen Gross and  RADIOGRAPHIC STUDIES:  Nm Sentinel Node Inj-no Rpt (breast)  02/21/2012  CLINICAL DATA: left breast cancer   Sulfur colloid was injected intradermally by the nuclear medicine  technologist for breast cancer sentinel node localization.     US Breast Bx W Loc Dev 1st Lesion Img Bx Spec US Guide  02/21/2012  *RADIOLOGY REPORT*  Clinical Data:  left upper inner quadrant carcinoma  NEEDLE LOCALIZATION USING ULTRASOUND GUIDANCE AND SPECIMEN RADIOGRAPH  Comparison: Previous exams.  Patient presents for needle localization prior to lumpectomy.  I met with the patient and we discussed the procedure of needle localization including benefits and alternatives. We discussed the high likelihood of a successful procedure. We discussed the risks of the procedure, including infection, bleeding, tissue injury, and further surgery. Informed, written consent was given.  Using ultrasound  guidance, sterile technique, 2% lidocaine and a 5cm modified Kopans needle, the mass and clip were localized using a radial oblique approach.  The films are marked for Dr. Jamey Ripa.  Specimen radiograph is performed at , and confirms marker clip present in the tissue sample.  The specimen is marked for pathology.  IMPRESSION: Needle localization left breast.  No apparent complications.   Original Report Authenticated By: Esperanza Heir, M.D.     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 Final pathology revealing a 0.3 cm disease sentinel nodes were negative for metastatic disease. Postoperatively patient is doing well. She is clinical stage I.  #2 patient also has some variance on her genetic testing but most likely these are benign.  #3 patient was begun on tamoxifen 20 mg daily starting in April 2014.  #4 anxiety/hot flashes: She was begun on Effexor 37.5 mg daily tolerating it well.  PLAN:  #1 overall you are doing well.  #2 suggest  continuing tamoxifen 20 mg daily as you're tolerating this well.  #3 continue Effexor 37.5 mg daily. If you feel the need to increase the dose you can call and we will be happy to get a prescription for 75 mg.  #4 mammogram performed in May showed no evidence of malignancy.  #5 I will continue to see you every 6 months.  #6 please call with any problems questions or concerns or if you need refills on your medications.   All questions were answered. The patient knows to call the clinic with any problems, questions or concerns. We can certainly see the patient much sooner if necessary.  I spent 25 minutes counseling the patient face to face. The total time spent in the appointment was 30 minutes.   Drue Second, MD Medical/Oncology [facility] [phone] (beeper) [phone] (Office)  08/18/2012, 12:58 PM

## 2012-08-18 NOTE — Telephone Encounter (Signed)
appts made and printed...td 

## 2012-08-18 NOTE — Patient Instructions (Addendum)
#  1 overall you are doing well.  #2 suggest continuing tamoxifen 20 mg daily as you're tolerating this well.  #3 continue Effexor 37.5 mg daily. If you feel the need to increase the dose you can call and we will be happy to get a prescription for 75 mg.  #4 mammogram performed in May showed no evidence of malignancy.  #5 I will continue to see you every 6 months.  #6 please call with any problems questions or concerns or if you need refills on your medications.

## 2012-09-10 ENCOUNTER — Ambulatory Visit (INDEPENDENT_AMBULATORY_CARE_PROVIDER_SITE_OTHER): Payer: 59

## 2012-09-10 ENCOUNTER — Encounter: Payer: Self-pay | Admitting: Gynecology

## 2012-09-10 ENCOUNTER — Ambulatory Visit (INDEPENDENT_AMBULATORY_CARE_PROVIDER_SITE_OTHER): Payer: 59 | Admitting: Gynecology

## 2012-09-10 VITALS — BP 112/64

## 2012-09-10 DIAGNOSIS — N8300 Follicular cyst of ovary, unspecified side: Secondary | ICD-10-CM

## 2012-09-10 DIAGNOSIS — N83209 Unspecified ovarian cyst, unspecified side: Secondary | ICD-10-CM

## 2012-09-10 DIAGNOSIS — Z853 Personal history of malignant neoplasm of breast: Secondary | ICD-10-CM

## 2012-09-10 DIAGNOSIS — N83201 Unspecified ovarian cyst, right side: Secondary | ICD-10-CM

## 2012-09-10 DIAGNOSIS — N831 Corpus luteum cyst of ovary, unspecified side: Secondary | ICD-10-CM

## 2012-09-10 DIAGNOSIS — N83202 Unspecified ovarian cyst, left side: Secondary | ICD-10-CM

## 2012-09-10 NOTE — Progress Notes (Signed)
Patient presented to the office today for ultrasound followup on previously seen left ovarian cyst. Patient was seen in the office on 06/18/2012.Patient with history of aT1 A. N0 invasive ductal carcinoma of the left breast on first year tamoxifen doing well. She has informed me that her BRCA1 and BRCA2 were negative for the most common mutation.   Ultrasound 06/21/2012 as follows: Uterus measured 8.7 x 5.3 x 4.3 cm with endometrial stripe 11.2 mm patient's last menstrual period was 05/31/2012. She has an anteverted arcuate uterus. Right ovary normal. Left ovary with thick wall cyst measuring 2.1 x 1.8 cm with positive color flow in the periphery, a thin walled cyst noted was noted measuring 2.6 x 1.9 cm with 2 thin septums and a solid focal area in the wall of this cyst measures 7 x 4 mm with color flow noted. Resistant index was 0.36. There was no fluid in the cul-de-sac. A normal CA 125 was drawn that day.  Ultrasound today: Uterus 8.9 x 5.8 x 4.6 cm with an endometrial stripe of 10.5 mm (patient a few days of starting her menses) right ovary thinwall echo-free follicle measuring 21 x 20 mm thinwall cyst reticular echo pattern measuring 26 x 27 x 22 mm with pulsatile flow the periphery. Left ovary normal previous has not seen free fluid in the cul-de-sac was noted.  Assessment/plan: Previous this the left ovarian cyst result. Small corpus luteum cyst noted. Recent CA 125 normal. Patient scheduled to return back next year for annual exam and we will follow with you early ultrasound.. Patient is having normal menstrual cycle. Any irregular bleeding will be reported so that we may do an endometrial biopsy since she is on tamoxifen.

## 2012-09-19 ENCOUNTER — Ambulatory Visit (INDEPENDENT_AMBULATORY_CARE_PROVIDER_SITE_OTHER): Payer: Commercial Managed Care - PPO | Admitting: Surgery

## 2012-09-19 ENCOUNTER — Ambulatory Visit
Admission: RE | Admit: 2012-09-19 | Discharge: 2012-09-19 | Disposition: A | Discharge status: Home / Self Care | Payer: 59 | Source: Ambulatory Visit | Attending: Surgery | Admitting: Surgery

## 2012-09-19 ENCOUNTER — Encounter (INDEPENDENT_AMBULATORY_CARE_PROVIDER_SITE_OTHER): Payer: Self-pay | Admitting: Surgery

## 2012-09-19 VITALS — BP 128/62 | HR 60 | Temp 98.0°F | Resp 18 | Ht 66.5 in | Wt 154.0 lb

## 2012-09-19 DIAGNOSIS — N63 Unspecified lump in unspecified breast: Secondary | ICD-10-CM

## 2012-09-19 DIAGNOSIS — N6320 Unspecified lump in the left breast, unspecified quadrant: Secondary | ICD-10-CM

## 2012-09-19 NOTE — Progress Notes (Signed)
NAME: Tina Parks       DOB: [birth year]           DATE: 09/19/2012       GNF:[id number]  CC:  Chief Complaint  Patient presents with  . Breast Problem    left br    HPI: She has completed therapy for a left breast cancer in the upper inner quadrant. She recently noted an area in the left breast below the lumpectomy and just above a tiny tattoo from the radiation that she was concerned about  EXAM: Vital signs: BP 128/62  Pulse 60  Temp(Src) 98 F (36.7 C)  Resp 18  Ht 5' 6.5" (1.689 m)  Wt 154 lb (69.854 kg)  BMI 24.49 kg/m2  LMP 08/20/2012  General: Patient alert, oriented, NAD  Left breast shows post lumpectomy and radiation changes. In there area in question there is some linear thickening, not hard, and which feels like fibrotic tissue. Remainder of breast is OK IMP: s/p lumpectmy, ? New mass  PLAN: Will check ultrasound today, and if neg, just routine f/u.  Jemmie Rhinehart J 09/19/2012

## 2012-09-25 ENCOUNTER — Other Ambulatory Visit: Payer: Self-pay | Admitting: Adult Health

## 2012-10-23 ENCOUNTER — Other Ambulatory Visit: Payer: Self-pay | Admitting: Oncology

## 2012-10-23 DIAGNOSIS — C50919 Malignant neoplasm of unspecified site of unspecified female breast: Secondary | ICD-10-CM

## 2012-10-23 MED ORDER — TAMOXIFEN CITRATE 20 MG PO TABS: 20.0000 mg | ORAL_TABLET | Freq: Every day | ORAL | Status: DC

## 2012-11-04 ENCOUNTER — Ambulatory Visit (INDEPENDENT_AMBULATORY_CARE_PROVIDER_SITE_OTHER): Payer: Commercial Managed Care - PPO | Admitting: Surgery

## 2012-11-04 ENCOUNTER — Encounter (INDEPENDENT_AMBULATORY_CARE_PROVIDER_SITE_OTHER): Payer: Self-pay | Admitting: Surgery

## 2012-11-04 VITALS — BP 104/62 | HR 72 | Temp 97.2°F | Resp 14 | Ht 66.5 in | Wt 157.0 lb

## 2012-11-04 DIAGNOSIS — Z853 Personal history of malignant neoplasm of breast: Secondary | ICD-10-CM

## 2012-11-04 NOTE — Patient Instructions (Signed)
Continue annual mammograms and see Dr Donell Beers for your next visit.

## 2012-11-04 NOTE — Progress Notes (Signed)
NAME: Tina Parks       DOB: [birth year]           DATE: 11/04/2012       AVW:[id number]  CC:  Chief Complaint  Patient presents with  . Breast Cancer Long Term Follow Up    HPI: this patient comes in for office visit about 6 months after completion of her radiation therapy for her stage I left breast cancer upper inner quadrant. She overall tolerated the therapy well.  EXAM: Vital signs: BP 104/62  Pulse 72  Temp(Src) 97.2 F (36.2 C) (Temporal)  Resp 14  Ht 5' 6.5" (1.689 m)  Wt 157 lb (71.215 kg)  BMI 24.96 kg/m2  General: Patient alert, oriented, NAD  The lumpectomy scar is somewhat thickened for the length of the scar but the underlying breast tissue appears normal. I think overall this is an excellent cosmetic result from her radiation, with the exception of the scar itself which shows some wider than norma appearance, IMP: doing well status post lumpectomy followed by radiation therapy  PLAN: RTC 6 months. She will follow up with Dr Ailene Ravel 11/04/2012

## 2012-11-06 ENCOUNTER — Other Ambulatory Visit: Payer: Self-pay | Admitting: Radiology

## 2012-11-06 NOTE — Telephone Encounter (Signed)
Patient needs renewal of her Singulair pended please advise.

## 2012-11-11 MED ORDER — MONTELUKAST SODIUM 10 MG PO TABS: 10.0000 mg | ORAL_TABLET | Freq: Every day | ORAL | Status: DC

## 2012-12-11 ENCOUNTER — Telehealth: Payer: Self-pay | Admitting: Family Medicine

## 2012-12-11 NOTE — Telephone Encounter (Signed)
Test message

## 2012-12-30 ENCOUNTER — Other Ambulatory Visit: Payer: Self-pay | Admitting: Adult Health

## 2013-01-27 ENCOUNTER — Other Ambulatory Visit: Payer: Self-pay | Admitting: *Deleted

## 2013-01-27 MED ORDER — VENLAFAXINE HCL ER 37.5 MG PO CP24: ORAL_CAPSULE | ORAL | Status: DC

## 2013-02-19 ENCOUNTER — Other Ambulatory Visit (HOSPITAL_BASED_OUTPATIENT_CLINIC_OR_DEPARTMENT_OTHER): Payer: 59

## 2013-02-19 ENCOUNTER — Encounter: Payer: Self-pay | Admitting: Oncology

## 2013-02-19 ENCOUNTER — Ambulatory Visit (HOSPITAL_BASED_OUTPATIENT_CLINIC_OR_DEPARTMENT_OTHER): Payer: 59 | Admitting: Oncology

## 2013-02-19 ENCOUNTER — Telehealth: Payer: Self-pay | Admitting: Oncology

## 2013-02-19 VITALS — BP 107/71 | HR 74 | Temp 98.8°F | Resp 18 | Ht 66.5 in | Wt 156.0 lb

## 2013-02-19 DIAGNOSIS — Z170 Estrogen receptor positive status [ER+]: Secondary | ICD-10-CM

## 2013-02-19 DIAGNOSIS — C50219 Malignant neoplasm of upper-inner quadrant of unspecified female breast: Secondary | ICD-10-CM

## 2013-02-19 DIAGNOSIS — C50212 Malignant neoplasm of upper-inner quadrant of left female breast: Secondary | ICD-10-CM

## 2013-02-19 DIAGNOSIS — F411 Generalized anxiety disorder: Secondary | ICD-10-CM

## 2013-02-19 DIAGNOSIS — R61 Generalized hyperhidrosis: Secondary | ICD-10-CM

## 2013-02-19 LAB — COMPREHENSIVE METABOLIC PANEL (CC13)
ALK PHOS: 36 U/L — AB (ref 40–150)
ALT: 19 U/L (ref 0–55)
AST: 24 U/L (ref 5–34)
Albumin: 3.6 g/dL (ref 3.5–5.0)
Anion Gap: 6 mEq/L (ref 3–11)
BILIRUBIN TOTAL: 0.48 mg/dL (ref 0.20–1.20)
BUN: 9.5 mg/dL (ref 7.0–26.0)
CO2: 29 mEq/L (ref 22–29)
CREATININE: 0.8 mg/dL (ref 0.6–1.1)
Calcium: 9.2 mg/dL (ref 8.4–10.4)
Chloride: 105 mEq/L (ref 98–109)
GLUCOSE: 101 mg/dL (ref 70–140)
Potassium: 4.3 mEq/L (ref 3.5–5.1)
SODIUM: 140 meq/L (ref 136–145)
TOTAL PROTEIN: 6.1 g/dL — AB (ref 6.4–8.3)

## 2013-02-19 LAB — CBC WITH DIFFERENTIAL/PLATELET
BASO%: 0.3 % (ref 0.0–2.0)
Basophils Absolute: 0 10*3/uL (ref 0.0–0.1)
EOS ABS: 0.2 10*3/uL (ref 0.0–0.5)
EOS%: 4 % (ref 0.0–7.0)
HCT: 37.9 % (ref 34.8–46.6)
HGB: 12.8 g/dL (ref 11.6–15.9)
LYMPH%: 34.9 % (ref 14.0–49.7)
MCH: 30.7 pg (ref 25.1–34.0)
MCHC: 33.8 g/dL (ref 31.5–36.0)
MCV: 90.9 fL (ref 79.5–101.0)
MONO#: 0.3 10*3/uL (ref 0.1–0.9)
MONO%: 7.5 % (ref 0.0–14.0)
NEUT%: 53.3 % (ref 38.4–76.8)
NEUTROS ABS: 2 10*3/uL (ref 1.5–6.5)
PLATELETS: 181 10*3/uL (ref 145–400)
RBC: 4.17 10*6/uL (ref 3.70–5.45)
RDW: 12.5 % (ref 11.2–14.5)
WBC: 3.7 10*3/uL — ABNORMAL LOW (ref 3.9–10.3)
lymph#: 1.3 10*3/uL (ref 0.9–3.3)

## 2013-02-19 NOTE — Telephone Encounter (Signed)
, °

## 2013-02-19 NOTE — Progress Notes (Signed)
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 Eston Esters Dr. Thea Silversmith and Dr. Neldon Mc on 12/19/2011 with a new diagnosis of left-sided invasive ductal carcinoma stage I ER positive PR positive HER-2/neu negative. Recommendations for a left lumpectomy with sentinel lymph node biopsy. She underwent this on 02/21/2012. Her final pathology revealed a 0.3 cm invasive ductal carcinoma that was grade 1. No lymphovascular invasion margins were -2 sentinel nodes were negative for metastatic disease tumor was ER +100% PR +100% HER-2/neu negative with a Ki-67 3%.  #2 patient had genetic testing and counseling performed. She had a gene BX panel performed for breast and ovarian cancer panel. 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 She was also found to have a variant FAM175a, c.1139A>G (p.Asp380Gly).  #3 patient to proceed with radiation therapy adjuvantly with Dr. Thea Silversmith from 3/4-3/31.  #4 Adjuvant antiestrogen therapy with tamoxifen 20 mg daily beginning 04/2012  CURRENT THERAPY: Tamoxifen daily  INTERVAL HISTORY: Tina Parks 43 y.o. female returns for followup visit  For her left breast cancer.  She is doing well today.  She is now on tamoxifen as well as Effexor. The hot flashes have significantly diminished since being on the Effexor. She has not had any headaches double vision blurring of vision no shortness of breath chest pains no cramping in her legs no vaginal bleeding or discharge. Remainder of the 10 point review of systems is negative.  MEDICAL HISTORY: Past Medical History  Diagnosis Date  . CIN I (cervical intraepithelial neoplasia I)   . Stress  fracture 2011    Left Hip  . Depression   . Anxiety   . Sinusitis   . Bronchial spasm     Exercise induced  . Family history of anesthesia complication     significant PONV  . Hx of radiation therapy 03/31/12 - 04/28/12    left breast  . Breast cancer 12/13/11    left, ER/PR +, Her 2 -, treated with radiation    ALLERGIES:  has No Known Allergies.  MEDICATIONS:  Current Outpatient Prescriptions  Medication Sig Dispense Refill  . ALPRAZolam (XANAX) 1 MG tablet Take 1 mg by mouth at bedtime as needed. sleep      . montelukast (SINGULAIR) 10 MG tablet Take 1 tablet (10 mg total) by mouth daily.  90 tablet  3  . tamoxifen (NOLVADEX) 20 MG tablet Take 1 tablet (20 mg total) by mouth daily.  90 tablet  1  . valACYclovir (VALTREX) 1000 MG tablet Take 1,000 mg by mouth as needed. For fever blister      . venlafaxine XR (EFFEXOR-XR) 37.5 MG 24 hr capsule TAKE 1 CAPSULE BY MOUTH DAILY  90 capsule  0   No current facility-administered medications for this visit.    SURGICAL HISTORY:  Past Surgical History  Procedure Laterality Date  . Colposcopy    . Wisdom tooth extraction  1987    Sedation only  . Breast lumpectomy with needle localization and axillary sentinel lymph node bx  02/21/2012    Procedure: BREAST LUMPECTOMY WITH NEEDLE LOCALIZATION AND AXILLARY SENTINEL LYMPH NODE BX;  Surgeon: Haywood Lasso, MD;  Location: [facility];  Service: General;  Laterality: Left;  break in sterile field to initiate CPR  . Breast surgery  2014    left lumpectomy- lymphnode sampling .Marland Kitchen    REVIEW OF SYSTEMS:   General: fatigue (-), night sweats (-), fever (-), pain (-) Lymph: palpable nodes (-) HEENT: vision changes (-), mucositis (-), gum bleeding (-), epistaxis (-) Cardiovascular: chest pain (-), palpitations (-) Pulmonary: shortness of breath (-), dyspnea on exertion (-), cough (-), hemoptysis (-) GI:  Early satiety (-), melena (-), dysphagia (-), nausea/vomiting (-),  diarrhea (-) GU: dysuria (-), hematuria (-), incontinence (-) Musculoskeletal: joint swelling (-), joint pain (-), back pain (-) Neuro: weakness (-), numbness (-), headache (-), confusion (-) Skin: Rash (-), lesions (-), dryness (-) Psych: depression (-), suicidal/homicidal ideation (-), feeling of hopelessness (-)   Health Maintenance  Mammogram: 11/2011 Colonoscopy: n/a Bone Density Scan: 2-3 years ago Pap Smear: 05/2011 Eye Exam: 03/2012 Vitamin D Level: 05/2011 Lipid Panel: 05/2011   PHYSICAL EXAMINATION: Blood pressure 107/71, pulse 74, temperature 98.8 F (37.1 C), temperature source Oral, resp. rate 18, height 5' 6.5" (1.689 m), weight 156 lb (70.761 kg). Body mass index is 24.8 kg/(m^2). General: Patient is a well appearing female in no acute distress HEENT: PERRLA, sclerae anicteric no conjunctival pallor, MMM Neck: supple, no palpable adenopathy Lungs: clear to auscultation bilaterally, no wheezes, rhonchi, or rales Cardiovascular: regular rate rhythm, S1, S2, no murmurs, rubs or gallops Abdomen: Soft, non-tender, non-distended, normoactive bowel sounds, no HSM Extremities: warm and well perfused, no clubbing, cyanosis, or edema Skin: No rashes or lesions Neuro: Non-focal Breasts: ECOG PERFORMANCE STATUS: 0 - Asymptomatic    Right breast: No masses nipple discharge or skin changes. Left breast reveals well-healed excisional scar no masses nipple discharge skin changes.  LABORATORY DATA: Lab Results  Component Value Date   WBC 3.7* 02/19/2013   HGB 12.8 02/19/2013   HCT 37.9 02/19/2013   MCV 90.9 02/19/2013   PLT 181 02/19/2013      Chemistry      Component Value Date/Time   NA 140 02/19/2013 0918   NA 140 02/21/2012 1250   K 4.3 02/19/2013 0918   K 4.1 02/21/2012 1250   CL 102 05/19/2012 1431   CL 104 02/21/2012 1250   CO2 29 02/19/2013 0918   CO2 26 02/21/2012 1250   BUN 9.5 02/19/2013 0918   BUN 14 02/21/2012 1250   CREATININE 0.8 02/19/2013 0918   CREATININE  0.71 02/21/2012 1250      Component Value Date/Time   CALCIUM 9.2 02/19/2013 0918   CALCIUM 8.5 02/21/2012 1250   ALKPHOS 36* 02/19/2013 0918   ALKPHOS 34* 02/21/2012 1250   AST 24 02/19/2013 0918   AST 20 02/21/2012 1250   ALT 19 02/19/2013 0918   ALT 16 02/21/2012 1250   BILITOT 0.48 02/19/2013 0918   BILITOT 0.2* 02/21/2012 1250     ADDITIONAL INFORMATION: 1. CHROMOGENIC IN-SITU HYBRIDIZATION Interpretation HER-2/NEU BY CISH - NO AMPLIFICATION OF HER-2 DETECTED. THE RATIO OF HER-2: CEP 17 SIGNALS WAS 1.40. Reference range: Ratio: HER2:CEP17 < 1.8 - gene amplification not observed Ratio: HER2:CEP 17 1.8-2.2 - equivocal result Ratio: HER2:CEP17 > 2.2 - gene amplification observed Claudette Laws MD Pathologist, Electronic Signature ( Signed 02/29/2012) FINAL DIAGNOSIS Diagnosis 1. Breast, lumpectomy, Left - INVASIVE GRADE I DUCTAL CARCINOMA, SPANNING 0.3 CM IN GREATEST DIMENSION - LYMPH/VASCULAR INVASION IS NOT IDENTIFIED. - MARGINS ARE NEGATIVE. - SEE ONCOLOGY TEMPLATE 2. Lymph node, sentinel, biopsy, Left axillary #1 - ONE BENIGN LYMPH  NODE WITH NO TUMOR SEEN (0/1). 3. Lymph node, sentinel, biopsy, Left axillary #2 - ONE BENIGN LYMPH NODE WITH NO TUMOR SEEN (0/1). Microscopic Comment 1. BREAST, INVASIVE TUMOR, WITH LYMPH NODE SAMPLING 1 of 3 FINAL for Majeed, Ananya S ([id number]) Microscopic Comment(continued) Specimen, including laterality: Left partial breast. Procedure: Left breast lumpectomy. Grade: I. Tubule formation: 1. Nuclear pleomorphism: 1. Mitotic: 1. Tumor size (glass slide measurement): 0.3 cm. Margins: Invasive, distance to closest margin: 0.25 cm (posterior margin). Lymphovascular invasion: Not identified. Ductal carcinoma in situ: Not identified. Lobular neoplasia: Not identified. Tumor focality: Unifocal. Treatment effect: N/A. Extent of tumor: Tumor confined to breast parenchyma. Lymph nodes: # examined: 2. Lymph nodes with metastasis: 0. Breast  prognostic profile: Performed on previous case ([id number]) Estrogen receptor: 100%, positive. Progesterone receptor: 100%, positive. Her 2 neu: 1.52, no amplification. Ki-67: 3%, low. Non-neoplastic breast: Fibrocystic changes with calcification. TNM: pT1a, pN0, pMX. Comments: Due to the lobulated and bland appearance of the tumor, immunohistochemical stains are performed to prove that invasive carcinoma is present. In the focus suspicious for tumor, the proliferating area shows negativity for p63, calponin and smooth muscle myosin confirming the above diagnosis. Of note, there is very limited tumor on the left breast lumpectomy specimen with the largest focus measuring 0.3 cm. The previous needle core biopsy is re-reviewed which shows a focus of invasive ductal carcinoma spanning 0.4 cm. Regardless, the T stage is pT1a. A Her-2 neu by CISH will be repeated on the current tumor and reported in an addendum. (RH:kh 02-25-12) Willeen Niece MD Pathologist, Electronic Signature (Case signed 02/26/2012) Specimen Gross and  RADIOGRAPHIC STUDIES:  Nm Sentinel Node Inj-no Rpt (breast)  02/21/2012  CLINICAL DATA: left breast cancer   Sulfur colloid was injected intradermally by the nuclear medicine  technologist for breast cancer sentinel node localization.     US Breast Bx W Loc Dev 1st Lesion Img Bx Spec US Guide  02/21/2012  *RADIOLOGY REPORT*  Clinical Data:  left upper inner quadrant carcinoma  NEEDLE LOCALIZATION USING ULTRASOUND GUIDANCE AND SPECIMEN RADIOGRAPH  Comparison: Previous exams.  Patient presents for needle localization prior to lumpectomy.  I met with the patient and we discussed the procedure of needle localization including benefits and alternatives. We discussed the high likelihood of a successful procedure. We discussed the risks of the procedure, including infection, bleeding, tissue injury, and further surgery. Informed, written consent was given.  Using ultrasound  guidance, sterile technique, 2% lidocaine and a 5cm modified Kopans needle, the mass and clip were localized using a radial oblique approach.  The films are marked for Dr. Margot Chimes.  Specimen radiograph is performed at , and confirms marker clip present in the tissue sample.  The specimen is marked for pathology.  IMPRESSION: Needle localization left breast.  No apparent complications.   Original Report Authenticated By: Skipper Cliche, M.D.     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 Final pathology revealing a 0.3 cm disease sentinel nodes were negative for metastatic disease. Postoperatively patient is doing well. She is clinical stage I.  #2 patient also has some variance on her genetic testing but most likely these are benign.  #3 patient was begun on tamoxifen 20 mg daily starting in April 2014.  #4 anxiety/hot flashes: She was begun on Effexor 37.5 mg daily tolerating it well.  PLAN:  #1Overall patient doing well, no evidence of recurrent disease  2.  Continue tamoxifen 20 mg daily  3. I will see her back in 6 months     All questions were answered. The patient knows to call the clinic with any problems, questions or concerns. We can certainly see the patient much sooner if necessary.  I spent 25 minutes counseling the patient face to face. The total time spent in the appointment was 30 minutes.   Marcy Panning, MD Medical/Oncology [facility] [phone] (beeper) [phone] (Office)  02/19/2013, 10:22 AM

## 2013-04-28 ENCOUNTER — Other Ambulatory Visit: Payer: Self-pay | Admitting: Oncology

## 2013-05-11 ENCOUNTER — Other Ambulatory Visit (INDEPENDENT_AMBULATORY_CARE_PROVIDER_SITE_OTHER): Payer: Self-pay | Admitting: General Surgery

## 2013-05-11 DIAGNOSIS — Z9889 Other specified postprocedural states: Secondary | ICD-10-CM

## 2013-05-11 DIAGNOSIS — Z853 Personal history of malignant neoplasm of breast: Secondary | ICD-10-CM

## 2013-06-01 ENCOUNTER — Ambulatory Visit (INDEPENDENT_AMBULATORY_CARE_PROVIDER_SITE_OTHER): Payer: 59 | Admitting: Gynecology

## 2013-06-01 ENCOUNTER — Encounter: Payer: Self-pay | Admitting: Gynecology

## 2013-06-01 VITALS — BP 112/78 | Ht 66.5 in | Wt 157.0 lb

## 2013-06-01 DIAGNOSIS — Z01419 Encounter for gynecological examination (general) (routine) without abnormal findings: Secondary | ICD-10-CM

## 2013-06-01 DIAGNOSIS — Z853 Personal history of malignant neoplasm of breast: Secondary | ICD-10-CM

## 2013-06-01 DIAGNOSIS — N83201 Unspecified ovarian cyst, right side: Secondary | ICD-10-CM

## 2013-06-01 DIAGNOSIS — N83209 Unspecified ovarian cyst, unspecified side: Secondary | ICD-10-CM

## 2013-06-01 LAB — LIPID PANEL
CHOL/HDL RATIO: 2.9 ratio
CHOLESTEROL: 212 mg/dL — AB (ref 0–200)
HDL: 73 mg/dL (ref >39)
LDL Cholesterol: 127 mg/dL — ABNORMAL HIGH (ref 0–99)
TRIGLYCERIDES: 58 mg/dL (ref <150)
VLDL: 12 mg/dL (ref 0–40)

## 2013-06-01 NOTE — Progress Notes (Signed)
Tina Parks [birth year] [id number]   History:    43 y.o.  (PA) for annual gyn exam with no complaints today. Patient with prior history of left lumpectomy as a result of screening mammogram having noted a suspicious lesion. 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 Recommendations for a left lumpectomy with sentinel lymph node biopsy. She underwent this on 02/21/2012. Her final pathology revealed a 0.3 cm invasive ductal carcinoma that was grade 1. No lymphovascular invasion margins were -2 sentinel nodes were negative for metastatic disease tumor was ER +100% PR +100% HER-2/neu negative with a Ki-67 3%.   patient had genetic testing and counseling performed. She had a gene BX panel performed for breast and ovarian cancer panel. 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 She was also found to have a variant FAM175a, c.1139A>G (p.Asp380Gly). The patient was negative for BRCA1 and BRCA2 gene mutation.  Patient completed radiation therapy and is now on tamoxifen. Patient's husband had a vasectomy. Patient is having normal menstrual cycles.Patient with past history of CIN-1 and 2005 subsequent Pap smears been normal. She had a bone density study done in 2011 secondary to stress fracture of the left hip and family history of osteoporosis her lowest Z. Score was at the right femoral neck with a value -1.2.  The patient had been seen in the office in August 2014 for followup on the right ovary and cyst with the result as follows: Uterus 8.9 x 5.8 x 4.6 cm with an endometrial stripe of 10.5 mm (patient a few days of starting her menses) right ovary thinwall echo-free follicle measuring 21 x 20 mm thinwall cyst reticular echo pattern measuring 26 x 27 x 22 mm with pulsatile flow the periphery. Left ovary normal previous has not seen free fluid in the cul-de-sac was noted.  She had a normal CA 125.    Past medical history,surgical history, family history and social history were all reviewed and documented in the EPIC chart.  Gynecologic History Patient's last menstrual period was 05/22/2013. Contraception: vasectomy Last Pap: 2013. Results were: normal Last mammogram: 2014. Results were: normal  Obstetric History OB History  Gravida Para Term Preterm AB SAB TAB Ectopic Multiple Living  1 1 1       1     # Outcome Date GA Lbr Len/2nd Weight Sex Delivery Anes PTL Lv  1 TRM             Obstetric Comments  G1, P1, NuvaRing 1990-2013     ROS: A ROS was performed and pertinent positives and negatives are included in the history.  GENERAL: No fevers or chills. HEENT: No change in vision, no earache, sore throat or sinus congestion. NECK: No pain or stiffness. CARDIOVASCULAR: No chest pain or pressure. No palpitations. PULMONARY: No shortness of breath, cough or wheeze. GASTROINTESTINAL: No abdominal pain, nausea, vomiting or diarrhea, melena or bright red blood per rectum. GENITOURINARY: No urinary frequency, urgency, hesitancy or dysuria. MUSCULOSKELETAL: No joint or muscle pain, no back pain, no recent trauma. DERMATOLOGIC: No rash, no itching, no lesions. ENDOCRINE: No polyuria, polydipsia, no heat or cold intolerance. No recent change in weight. HEMATOLOGICAL: No anemia or easy bruising or bleeding. NEUROLOGIC: No headache, seizures, numbness, tingling or weakness. PSYCHIATRIC: No depression, no loss of interest in normal activity or change in sleep pattern.     Exam: chaperone  present  BP 112/78  Ht 5' 6.5" (1.689 m)  Wt 157 lb (71.215 kg)  BMI 24.96 kg/m2  LMP 05/22/2013  Body mass index is 24.96 kg/(m^2).  General appearance : Well developed well nourished female. No acute distress HEENT: Neck supple, trachea midline, no carotid bruits, no thyroidmegaly Lungs: Clear to auscultation, no rhonchi or wheezes, or rib retractions  Heart: Regular rate  and rhythm, no murmurs or gallops Breast:Examined in sitting and supine position were symmetrical in appearance, no palpable masses or tenderness,  no skin retraction, no nipple inversion, no nipple discharge, no skin discoloration, no axillary or supraclavicular lymphadenopathy Abdomen: no palpable masses or tenderness, no rebound or guarding Extremities: no edema or skin discoloration or tenderness  Pelvic:  Bartholin, Urethra, Skene Glands: Within normal limits             Vagina: No gross lesions or discharge  Cervix: No gross lesions or discharge  Uterus  anteverted, normal size, shape and consistency, non-tender and mobile  Adnexa  Without masses or tenderness  Anus and perineum  normal   Rectovaginal  normal sphincter tone without palpated masses or tenderness             Hemoccult not indicated     Assessment/Plan:  43 y.o. 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 Patient's been doing well. All her laboratory normal with the exception that she has not had a fasting lipid profile, TSH and urinalysis would be done today. Pap smear was not done today in accordance to the new guidelines. She will schedule for an ultrasound to follow up with a small right ovarian cyst. She is due for her mammogram later this year. We discussed the importance of calcium vitamin D and regular exercise for osteoporosis prevention.   Note: This dictation was prepared with  Dragon/digital dictation along withSmart phrase technology. Any transcriptional errors that result from this process are unintentional.   Terrance Mass MD, 11:04 AM 06/01/2013

## 2013-06-02 LAB — URINALYSIS W MICROSCOPIC + REFLEX CULTURE
BILIRUBIN URINE: NEGATIVE
Bacteria, UA: NONE SEEN
CASTS: NONE SEEN
Crystals: NONE SEEN
GLUCOSE, UA: NEGATIVE mg/dL
HGB URINE DIPSTICK: NEGATIVE
KETONES UR: NEGATIVE mg/dL
Leukocytes, UA: NEGATIVE
Nitrite: NEGATIVE
PH: 5.5 (ref 5.0–8.0)
PROTEIN: NEGATIVE mg/dL
Specific Gravity, Urine: 1.016 (ref 1.005–1.030)
Squamous Epithelial / LPF: NONE SEEN
Urobilinogen, UA: 0.2 mg/dL (ref 0.0–1.0)

## 2013-06-02 LAB — TSH: TSH: 0.84 u[IU]/mL (ref 0.350–4.500)

## 2013-06-12 ENCOUNTER — Encounter (INDEPENDENT_AMBULATORY_CARE_PROVIDER_SITE_OTHER): Payer: Self-pay | Admitting: General Surgery

## 2013-06-12 ENCOUNTER — Ambulatory Visit (INDEPENDENT_AMBULATORY_CARE_PROVIDER_SITE_OTHER): Payer: Commercial Managed Care - PPO | Admitting: General Surgery

## 2013-06-12 VITALS — BP 100/62 | HR 64 | Resp 18 | Ht 66.0 in | Wt 155.0 lb

## 2013-06-12 DIAGNOSIS — C50219 Malignant neoplasm of upper-inner quadrant of unspecified female breast: Secondary | ICD-10-CM

## 2013-06-12 NOTE — Progress Notes (Signed)
  HISTORY: The patient is approximately a year after finishing up her radiation following breast conservation therapy in January of 2014. She had a T1 N0 breast cancer.  Her surgery was complicated by cardiac arrest during the surgery.  She underwent cardiac workup and this was felt to be a result of dehydration and vagal reaction. No other cardiac arrhythmia or abnormality was found.    She subsequently completed radiation therapy and is now taking tamoxifen.  Genetic testing was essentially negative.  Hot flashes have been controlled with Effexor.  She has not felt any new breast lesions. Her only complaint is some thickening of her upper inner quadrant breast scar.   PERTINENT REVIEW OF SYSTEMS: Otherwise negative x 11.  Filed Vitals:   06/12/13 1130  BP: 100/62  Pulse: 64  Resp: 18   Wt Readings from Last 3 Encounters:  06/12/13 155 lb (70.308 kg)  06/01/13 157 lb (71.215 kg)  02/19/13 156 lb (70.761 kg)    EXAM: Head: Normocephalic and atraumatic.  Eyes:  Conjunctivae are normal. Pupils are equal, round, and reactive to light. No scleral icterus.  Neck:  Normal range of motion. Neck supple. No tracheal deviation present. No thyromegaly present.  Resp: No respiratory distress, normal effort. Breast:  Dense breast tissue bilaterally.  No palpable nodules/masses.  No nipple retraction or discharge.  No skin dimpling.  No lymphadenopathy in axillae, cervical regions, infra or supraclavicular areas.   Abd:  Abdomen is soft, non distended and non tender. No masses are palpable.  There is no rebound and no guarding.  Neurological: Alert and oriented to person, place, and time. Coordination normal.  Skin: Skin is warm and dry. No rash noted. No diaphoretic. No erythema. No pallor.  Psychiatric: Normal mood and affect. Normal behavior. Judgment and thought content normal.      ASSESSMENT AND PLAN:   Cancer of upper-inner quadrant of female breast The patient has no clinical evidence  of disease. She has her mammogram scheduled for 2 weeks from now.  She is continuing on tamoxifen. The Effexor has been managing her symptoms of hot flashes.  She is scheduled to see someone in the [place] this summer. I will see her back in approximately 6 months.       Milus Height, MD Surgical Oncology, [facility]  WEBBER,SARAH, PA-C No ref. provider found

## 2013-06-12 NOTE — Patient Instructions (Signed)
Follow up in 6 months.  Please ask them to send me copy of mammogram.

## 2013-06-12 NOTE — Assessment & Plan Note (Signed)
The patient has no clinical evidence of disease. She has her mammogram scheduled for 2 weeks from now.  She is continuing on tamoxifen. The Effexor has been managing her symptoms of hot flashes.  She is scheduled to see someone in the [place] this summer. I will see her back in approximately 6 months.

## 2013-06-29 ENCOUNTER — Ambulatory Visit
Admission: RE | Admit: 2013-06-29 | Discharge: 2013-06-29 | Disposition: A | Discharge status: Home / Self Care | Payer: 59 | Source: Ambulatory Visit | Attending: General Surgery | Admitting: General Surgery

## 2013-06-29 DIAGNOSIS — Z9889 Other specified postprocedural states: Secondary | ICD-10-CM

## 2013-06-29 DIAGNOSIS — Z853 Personal history of malignant neoplasm of breast: Secondary | ICD-10-CM

## 2013-06-30 ENCOUNTER — Encounter: Payer: Self-pay | Admitting: Gynecology

## 2013-07-01 ENCOUNTER — Other Ambulatory Visit: Payer: Self-pay | Admitting: Gynecology

## 2013-07-01 ENCOUNTER — Ambulatory Visit (INDEPENDENT_AMBULATORY_CARE_PROVIDER_SITE_OTHER): Payer: 59

## 2013-07-01 ENCOUNTER — Encounter: Payer: Self-pay | Admitting: Gynecology

## 2013-07-01 ENCOUNTER — Ambulatory Visit (INDEPENDENT_AMBULATORY_CARE_PROVIDER_SITE_OTHER): Payer: 59 | Admitting: Gynecology

## 2013-07-01 DIAGNOSIS — N83209 Unspecified ovarian cyst, unspecified side: Secondary | ICD-10-CM

## 2013-07-01 DIAGNOSIS — N83202 Unspecified ovarian cyst, left side: Secondary | ICD-10-CM

## 2013-07-01 DIAGNOSIS — Z853 Personal history of malignant neoplasm of breast: Secondary | ICD-10-CM

## 2013-07-01 DIAGNOSIS — N83201 Unspecified ovarian cyst, right side: Secondary | ICD-10-CM

## 2013-07-01 NOTE — Progress Notes (Signed)
   The patient presented to the office today to discuss her ultrasound. Patient was seen for her annual exam in 06/01/2013. Her history is as follows:  Patient with prior history of left lumpectomy as a result of screening mammogram having noted a suspicious lesion. Her breast cancer was diagnosed as  invasive ductal carcinoma stage I ER positive PR positive HER-2/neu negative. Recommendations for a left lumpectomy with sentinel lymph node biopsy. She underwent this on 02/21/2012. Her final pathology revealed a 0.3 cm invasive ductal carcinoma that was grade 1. No lymphovascular invasion margins were -2 sentinel nodes were negative for metastatic disease tumor was ER +100% PR +100% HER-2/neu negative with a Ki-67 3%.  patient had genetic testing and counseling performed. She had a gene BX panel performed for breast and ovarian cancer panel. 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 She was also found to have a variant FAM175a, c.1139A>G (p.Asp380Gly). The patient was negative for BRCA1 and BRCA2 gene mutation.   Patient completed radiation therapy and is now on tamoxifen.  Patient's husband had a vasectomy. Patient is having normal menstrual cycles.Patient with past history of CIN-1 and 2005 subsequent Pap smears been normal. She had a bone density study done in 2011 secondary to stress fracture of the left hip and family history of osteoporosis her lowest Z. Score was at the right femoral neck with a value -1.2.  The patient in August 2014 had a followup of her right ovarian cyst which did demonstrate a thin ankle free follicle measuring 21 x 20 mm with a thin wall reticular echo pattern measuring 26 x 27 x 22 mm pulsatile flow at the periphery. Left ovary was normal previous this not seen she had a normal CA 125.  Her ultrasound today demonstrated the following: Uterus measured 8.6 x 5.7 x 4.2 cm with endometrial stripe 5.0 mm (patient  currently demonstrating). Right ovary normal. Previous this not seen. Patient now with a left ovarian simple echo free follicle cyst measured 30 x 24 x 24 mm with layering of debris and avascular. No fluid in the cul-de-sac.  We discussed this and she has had a negative BRCA1 and BRCA2  as well as normal CA 125 and alternating follicular cysts we did not need to be followed every 6 months with ultrasound and she feels comfortable with once a year. She was otherwise asymptomatic today. Patient a normal Pap smear in 2013.

## 2013-07-16 ENCOUNTER — Other Ambulatory Visit: Payer: Self-pay | Admitting: Physician Assistant

## 2013-07-16 MED ORDER — VALACYCLOVIR HCL 1 G PO TABS: 2000.0000 mg | ORAL_TABLET | ORAL | Status: DC | PRN

## 2013-08-06 ENCOUNTER — Telehealth: Payer: Self-pay | Admitting: Hematology and Oncology

## 2013-08-06 NOTE — Telephone Encounter (Signed)
, °

## 2013-08-21 ENCOUNTER — Ambulatory Visit: Payer: 59 | Admitting: Oncology

## 2013-08-21 ENCOUNTER — Other Ambulatory Visit: Payer: 59

## 2013-10-28 ENCOUNTER — Other Ambulatory Visit: Payer: Self-pay | Admitting: *Deleted

## 2013-10-28 ENCOUNTER — Other Ambulatory Visit: Payer: Self-pay | Admitting: Physician Assistant

## 2013-10-28 MED ORDER — MONTELUKAST SODIUM 10 MG PO TABS: 10.0000 mg | ORAL_TABLET | Freq: Every day | ORAL | Status: DC

## 2013-11-23 ENCOUNTER — Other Ambulatory Visit: Payer: Self-pay | Admitting: *Deleted

## 2013-11-23 ENCOUNTER — Ambulatory Visit: Payer: 59 | Admitting: Hematology and Oncology

## 2013-11-23 ENCOUNTER — Other Ambulatory Visit: Payer: 59

## 2013-11-23 DIAGNOSIS — C50219 Malignant neoplasm of upper-inner quadrant of unspecified female breast: Secondary | ICD-10-CM

## 2013-11-24 ENCOUNTER — Ambulatory Visit (HOSPITAL_BASED_OUTPATIENT_CLINIC_OR_DEPARTMENT_OTHER): Payer: 59 | Admitting: Hematology and Oncology

## 2013-11-24 ENCOUNTER — Telehealth: Payer: Self-pay | Admitting: Hematology and Oncology

## 2013-11-24 ENCOUNTER — Other Ambulatory Visit (HOSPITAL_BASED_OUTPATIENT_CLINIC_OR_DEPARTMENT_OTHER): Payer: 59

## 2013-11-24 VITALS — BP 115/73 | HR 94 | Temp 98.9°F | Resp 18 | Ht 66.0 in | Wt 160.1 lb

## 2013-11-24 DIAGNOSIS — C50219 Malignant neoplasm of upper-inner quadrant of unspecified female breast: Secondary | ICD-10-CM

## 2013-11-24 DIAGNOSIS — Z170 Estrogen receptor positive status [ER+]: Secondary | ICD-10-CM

## 2013-11-24 DIAGNOSIS — N951 Menopausal and female climacteric states: Secondary | ICD-10-CM

## 2013-11-24 DIAGNOSIS — C50212 Malignant neoplasm of upper-inner quadrant of left female breast: Secondary | ICD-10-CM

## 2013-11-24 LAB — COMPREHENSIVE METABOLIC PANEL (CC13)
ALBUMIN: 3.5 g/dL (ref 3.5–5.0)
ALK PHOS: 43 U/L (ref 40–150)
ALT: 14 U/L (ref 0–55)
AST: 15 U/L (ref 5–34)
Anion Gap: 8 mEq/L (ref 3–11)
BILIRUBIN TOTAL: 0.27 mg/dL (ref 0.20–1.20)
BUN: 10.3 mg/dL (ref 7.0–26.0)
CO2: 27 meq/L (ref 22–29)
Calcium: 9.2 mg/dL (ref 8.4–10.4)
Chloride: 104 mEq/L (ref 98–109)
Creatinine: 0.9 mg/dL (ref 0.6–1.1)
GLUCOSE: 135 mg/dL (ref 70–140)
POTASSIUM: 3.6 meq/L (ref 3.5–5.1)
SODIUM: 140 meq/L (ref 136–145)
TOTAL PROTEIN: 6.3 g/dL — AB (ref 6.4–8.3)

## 2013-11-24 LAB — CBC WITH DIFFERENTIAL/PLATELET
BASO%: 0.7 % (ref 0.0–2.0)
Basophils Absolute: 0 10*3/uL (ref 0.0–0.1)
EOS ABS: 0.1 10*3/uL (ref 0.0–0.5)
EOS%: 2.8 % (ref 0.0–7.0)
HCT: 38.5 % (ref 34.8–46.6)
HGB: 12.6 g/dL (ref 11.6–15.9)
LYMPH%: 30.7 % (ref 14.0–49.7)
MCH: 29.2 pg (ref 25.1–34.0)
MCHC: 32.8 g/dL (ref 31.5–36.0)
MCV: 89.1 fL (ref 79.5–101.0)
MONO#: 0.2 10*3/uL (ref 0.1–0.9)
MONO%: 6.5 % (ref 0.0–14.0)
NEUT%: 59.3 % (ref 38.4–76.8)
NEUTROS ABS: 2.2 10*3/uL (ref 1.5–6.5)
Platelets: 206 10*3/uL (ref 145–400)
RBC: 4.32 10*6/uL (ref 3.70–5.45)
RDW: 12.6 % (ref 11.2–14.5)
WBC: 3.8 10*3/uL — AB (ref 3.9–10.3)
lymph#: 1.2 10*3/uL (ref 0.9–3.3)

## 2013-11-24 NOTE — Progress Notes (Signed)
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 PEston EstersDr. SThea Silversmithand Dr. CNeldon Mcon 12/19/2011 with a new diagnosis of left-sided invasive ductal carcinoma stage I ER positive PR positive HER-2/neu negative. Recommendations for a left lumpectomy with sentinel lymph node biopsy. She underwent this on 02/21/2012. Her final pathology revealed a 0.3 cm invasive ductal carcinoma that was grade 1. No lymphovascular invasion margins were -2 sentinel nodes were negative for metastatic disease tumor was ER +100% PR +100% HER-2/neu negative with a Ki-67 3%.  #2 patient had genetic testing and counseling performed. She had a gene BX panel performed for breast and ovarian cancer panel. 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 She was also found to have a variant FAM175a, c.1139A>G (p.Asp380Gly).  #3 patient to proceed with radiation therapy adjuvantly with Dr. SThea Silversmithfrom 3/4-3/31.  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 She was treated with lumpectomy followed by radiation followed by adjuvant antiestrogen therapy with tamoxifen that started April 2014. She had a lot of hot flashes which have responded very well to Effexor. She is able to tolerate it much better than when she was on Zoloft. Otherwise she is without any new complaints or concerns.  REVIEW OF SYSTEMS:   Constitutional: Denies fevers, chills or abnormal weight loss Eyes: Denies blurriness of vision Ears, nose, mouth, throat, and face: Denies mucositis or sore throat Respiratory: Denies cough, dyspnea or wheezes Cardiovascular: Denies palpitation, chest discomfort or lower extremity swelling Gastrointestinal:  Denies nausea, heartburn or change in bowel habits Skin: Denies abnormal skin rashes Lymphatics: Denies new lymphadenopathy or easy bruising Neurological:Denies numbness, tingling or new weaknesses Behavioral/Psych: Mood is stable, no new changes  Breast:  denies any pain or lumps or nodules in either breasts All other systems were reviewed with the patient and are negative.  I have reviewed the past medical history, past surgical history, social history and family history with the patient and they are unchanged from previous note.  ALLERGIES:  has No Known Allergies.  MEDICATIONS:  Current Outpatient Prescriptions  Medication Sig Dispense Refill  . ALPRAZolam (XANAX) 1 MG tablet Take 1 mg by mouth at bedtime as needed. sleep      . montelukast (SINGULAIR) 10 MG tablet Take 1 tablet (10 mg total) by mouth daily.  90 tablet  3  . tamoxifen (NOLVADEX) 20 MG tablet TAKE 1 TABLET BY MOUTH ONCE DAILY  90 tablet  3  .  valACYclovir (VALTREX) 1000 MG tablet Take 2 tablets (2,000 mg total) by mouth as needed. For fever blister  30 tablet  12  . venlafaxine XR (EFFEXOR-XR) 37.5 MG 24 hr capsule TAKE 1 CAPSULE BY MOUTH DAILY  90 capsule  3   No  current facility-administered medications for this visit.    PHYSICAL EXAMINATION: ECOG PERFORMANCE STATUS: 0 - Asymptomatic  Filed Vitals:   11/24/13 1352  BP: 115/73  Pulse: 94  Temp: 98.9 F (37.2 C)  Resp: 18   Filed Weights   11/24/13 1352  Weight: 160 lb 2 oz (72.632 kg)    GENERAL:alert, no distress and comfortable SKIN: skin color, texture, turgor are normal, no rashes or significant lesions EYES: normal, Conjunctiva are pink and non-injected, sclera clear OROPHARYNX:no exudate, no erythema and lips, buccal mucosa, and tongue normal  NECK: supple, thyroid normal size, non-tender, without nodularity LYMPH:  no palpable lymphadenopathy in the cervical, axillary or inguinal LUNGS: clear to auscultation and percussion with normal breathing effort HEART: regular rate & rhythm and no murmurs and no lower extremity edema ABDOMEN:abdomen soft, non-tender and normal bowel sounds Musculoskeletal:no cyanosis of digits and no clubbing  NEURO: alert & oriented x 3 with fluent speech, no focal motor/sensory deficits  LABORATORY DATA:  I have reviewed the data as listed   Chemistry      Component Value Date/Time   NA 140 02/19/2013 0918   NA 140 02/21/2012 1250   K 4.3 02/19/2013 0918   K 4.1 02/21/2012 1250   CL 102 05/19/2012 1431   CL 104 02/21/2012 1250   CO2 29 02/19/2013 0918   CO2 26 02/21/2012 1250   BUN 9.5 02/19/2013 0918   BUN 14 02/21/2012 1250   CREATININE 0.8 02/19/2013 0918   CREATININE 0.71 02/21/2012 1250      Component Value Date/Time   CALCIUM 9.2 02/19/2013 0918   CALCIUM 8.5 02/21/2012 1250   ALKPHOS 36* 02/19/2013 0918   ALKPHOS 34* 02/21/2012 1250   AST 24 02/19/2013 0918   AST 20 02/21/2012 1250   ALT 19 02/19/2013 0918   ALT 16 02/21/2012 1250   BILITOT 0.48 02/19/2013 0918   BILITOT 0.2* 02/21/2012 1250       Lab Results  Component Value Date   WBC 3.8* 11/24/2013   HGB 12.6 11/24/2013   HCT 38.5 11/24/2013   MCV 89.1 11/24/2013   PLT 206 11/24/2013    NEUTROABS 2.2 11/24/2013     RADIOGRAPHIC STUDIES: Mammograms 2015 were normal  ASSESSMENT & PLAN:  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 Final pathology revealing a 0.3 cm disease sentinel nodes were negative, ER/PR positive HER-2 negative diagnosed November 2013 status post radiation and tamoxifen started April 2014  Surveillance: I. reviewed the mammogram done June 2015 which was normal. I discussed with her that her breast densities category C. I recommended continued annual mammogram surveillance along with physical exam in 6 months for  Tamoxifen counseling: I discussed with her that extended duration of tamoxifen therapy for 10 years has been found to be beneficial compared to 5 years. But he does come with increased side effects including risk of endometrial cancer DVT.  Hot flashes: Continue with Effexor 37.5 daily Return to clinic in 6 months for followup   No orders of the defined types were placed in this encounter.   The patient has a good  understanding of the overall plan. she agrees with it. She will call with any problems that may develop before her next visit here.  I spent 15 minutes counseling the patient face to face. The total time spent in the appointment was 15 minutes and more than 50% was on counseling and review of test results    Rulon Eisenmenger, MD 11/24/2013 2:16 PM

## 2013-11-24 NOTE — Assessment & Plan Note (Signed)
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 Final pathology revealing a 0.3 cm disease sentinel nodes were negative, ER/PR positive HER-2 negative diagnosed November 2013 status post radiation and tamoxifen started April 2014  Surveillance: I. reviewed the mammogram done June 2015 which was normal. I discussed with her that her breast densities category C. I recommended continued annual mammogram surveillance along with physical exam in 6 months for  Tamoxifen counseling: I discussed with her that extended duration of tamoxifen therapy for 10 years has been found to be beneficial compared to 5 years. But he does come with increased side effects including risk of endometrial cancer DVT.  Hot flashes: Continue with Effexor 37.5 daily Return to clinic in 6 months for followup

## 2013-11-30 ENCOUNTER — Encounter: Payer: Self-pay | Admitting: Gynecology

## 2014-04-04 ENCOUNTER — Ambulatory Visit (INDEPENDENT_AMBULATORY_CARE_PROVIDER_SITE_OTHER): Payer: 59

## 2014-04-04 DIAGNOSIS — Z23 Encounter for immunization: Secondary | ICD-10-CM

## 2014-04-27 ENCOUNTER — Other Ambulatory Visit: Payer: Self-pay | Admitting: Oncology

## 2014-05-24 ENCOUNTER — Ambulatory Visit (HOSPITAL_BASED_OUTPATIENT_CLINIC_OR_DEPARTMENT_OTHER): Payer: 59 | Admitting: Hematology and Oncology

## 2014-05-24 ENCOUNTER — Telehealth: Payer: Self-pay | Admitting: Hematology and Oncology

## 2014-05-24 VITALS — BP 134/72 | HR 95 | Temp 97.9°F | Resp 18 | Ht 66.0 in | Wt 161.3 lb

## 2014-05-24 DIAGNOSIS — Z853 Personal history of malignant neoplasm of breast: Secondary | ICD-10-CM

## 2014-05-24 DIAGNOSIS — C50212 Malignant neoplasm of upper-inner quadrant of left female breast: Secondary | ICD-10-CM

## 2014-05-24 DIAGNOSIS — N951 Menopausal and female climacteric states: Secondary | ICD-10-CM | POA: Diagnosis not present

## 2014-05-24 DIAGNOSIS — Z79810 Long term (current) use of selective estrogen receptor modulators (SERMs): Secondary | ICD-10-CM | POA: Diagnosis not present

## 2014-05-24 NOTE — Telephone Encounter (Signed)
per pof ot sch pt appt-gave pt copy of sch

## 2014-05-24 NOTE — Progress Notes (Signed)
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 PEston EstersDr. SThea Silversmithand Dr. CNeldon Mcon 12/19/2011 with a new diagnosis of left-sided invasive ductal carcinoma stage I ER positive PR positive HER-2/neu negative. Recommendations for a left lumpectomy with sentinel lymph node biopsy. She underwent this on 02/21/2012. Her final pathology revealed a 0.3 cm invasive ductal carcinoma that was grade 1. No lymphovascular invasion margins were -2 sentinel nodes were negative for metastatic disease tumor was ER +100% PR +100% HER-2/neu negative with a Ki-67 3%.  #2 patient had genetic testing and counseling performed. She had a gene BX panel performed for breast and ovarian cancer panel. 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 She was also found to have a variant FAM175a, c.1139A>G (p.Asp380Gly).  #3 patient to proceed with radiation therapy adjuvantly with Dr. SThea Silversmithfrom 3/4-3/31.  #4 Adjuvant antiestrogen therapy with tamoxifen 20 mg daily beginning 04/2012   CHIEF COMPLIANT: Follow-up of breast cancer on tamoxifen  INTERVAL HISTORY: Tina HONis a 44year old with above-mentioned history of left breast cancer diagnosed in 2013 and underwent  lumpectomy followed by radiation and is currently on tamoxifen since April 2014. She is tolerating tamoxifen fairly well. She is recently noticed increased hot flashes. 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, academic  REVIEW OF SYSTEMS:   Constitutional: Denies fevers, chills or abnormal weight loss Eyes: Denies blurriness of vision Ears, nose, mouth, throat, and face: Denies mucositis or sore throat Respiratory: Denies cough, dyspnea or wheezes Cardiovascular: Denies palpitation, chest discomfort or lower extremity swelling Gastrointestinal:  Denies nausea, heartburn or change in bowel habits Skin: Denies abnormal skin rashes Lymphatics: Denies new lymphadenopathy or easy bruising Neurological:Denies numbness, tingling or new weaknesses Behavioral/Psych: Mood is stable, no new changes  Breast:  denies any pain or lumps or nodules in either breasts, very occasionally discomfort at the surgical scar. All other systems were reviewed with the patient and are negative.  I have reviewed the past medical history, past surgical history, social history and family history with the patient and they are unchanged from previous note.  ALLERGIES:  has No Known Allergies.  MEDICATIONS:  Current Outpatient Prescriptions  Medication Sig Dispense Refill  . ALPRAZolam (XANAX) 1 MG tablet Take 1 mg by mouth at bedtime as needed. sleep    . montelukast (SINGULAIR) 10 MG tablet Take 1 tablet (10 mg total) by mouth daily. 90 tablet 3  . tamoxifen (NOLVADEX) 20 MG tablet TAKE 1 TABLET BY MOUTH ONCE DAILY 90 tablet 1  . valACYclovir (VALTREX) 1000 MG tablet Take 2 tablets (2,000 mg total) by mouth as needed. For fever blister 30 tablet 12  . venlafaxine XR (EFFEXOR-XR) 37.5 MG 24 hr capsule  TAKE 1 CAPSULE BY MOUTH ONCE DAILY 90 capsule 0   No current facility-administered medications for this visit.    PHYSICAL EXAMINATION: ECOG PERFORMANCE STATUS: 1 -  Symptomatic but completely ambulatory  Filed Vitals:   05/24/14 1515  BP: 134/72  Pulse: 95  Temp: 97.9 F (36.6 C)  Resp: 18   Filed Weights   05/24/14 1515  Weight: 161 lb 4.8 oz (73.165 kg)    GENERAL:alert, no distress and comfortable SKIN: skin color, texture, turgor are normal, no rashes or significant lesions EYES: normal, Conjunctiva are pink and non-injected, sclera clear OROPHARYNX:no exudate, no erythema and lips, buccal mucosa, and tongue normal  NECK: supple, thyroid normal size, non-tender, without nodularity LYMPH:  no palpable lymphadenopathy in the cervical, axillary or inguinal LUNGS: clear to auscultation and percussion with normal breathing effort HEART: regular rate & rhythm and no murmurs and no lower extremity edema ABDOMEN:abdomen soft, non-tender and normal bowel sounds Musculoskeletal:no cyanosis of digits and no clubbing  NEURO: alert & oriented x 3 with fluent speech, no focal motor/sensory deficits BREAST: No palpable masses or nodules in either right or left breasts. No palpable axillary supraclavicular or infraclavicular adenopathy no breast tenderness or nipple discharge. (exam performed in the presence of a chaperone)  LABORATORY DATA:  I have reviewed the data as listed   Chemistry      Component Value Date/Time   NA 140 11/24/2013 1335   NA 140 02/21/2012 1250   K 3.6 11/24/2013 1335   K 4.1 02/21/2012 1250   CL 102 05/19/2012 1431   CL 104 02/21/2012 1250   CO2 27 11/24/2013 1335   CO2 26 02/21/2012 1250   BUN 10.3 11/24/2013 1335   BUN 14 02/21/2012 1250   CREATININE 0.9 11/24/2013 1335   CREATININE 0.71 02/21/2012 1250      Component Value Date/Time   CALCIUM 9.2 11/24/2013 1335   CALCIUM 8.5 02/21/2012 1250   ALKPHOS 43 11/24/2013 1335   ALKPHOS 34* 02/21/2012 1250   AST 15 11/24/2013 1335   AST 20 02/21/2012 1250   ALT 14 11/24/2013 1335   ALT 16 02/21/2012 1250   BILITOT 0.27 11/24/2013 1335   BILITOT 0.2* 02/21/2012  1250       Lab Results  Component Value Date   WBC 3.8* 11/24/2013   HGB 12.6 11/24/2013   HCT 38.5 11/24/2013   MCV 89.1 11/24/2013   PLT 206 11/24/2013   NEUTROABS 2.2 11/24/2013   ASSESSMENT & PLAN:  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 Final pathology revealing a 0.3 cm disease sentinel nodes were negative, ER/PR positive HER-2 negative diagnosed November 2013 status post radiation and tamoxifen started April 2014  Tamoxifen toxicities: 1. Occasional hot flashes Patient gets annual GYN checkups  Breast cancer surveillance: 1. Mammograms to be done June 2016 I recommended 3-D mammogram. Mammogram in June 2015 showed breast density category C. 2. Physical exam of the breasts 05/24/2014 is normal.  Hot flashes: Continue with Effexor 37.5 daily. 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    Orders Placed This Encounter  Procedures  . CBC with Differential    Standing Status: Future     Number of Occurrences:      Standing Expiration Date: 05/24/2015  . Comprehensive metabolic panel (Cmet) - [facility]  Standing Status: Future     Number of Occurrences:      Standing Expiration Date: 05/24/2015   The patient has a good understanding of the overall plan. she agrees with it. She will call with any problems that may develop before her next visit here.   Rulon Eisenmenger, MD

## 2014-05-24 NOTE — Assessment & Plan Note (Signed)
T1 Tina Parks invasive ductal carcinoma of the left breast status post lumpectomy with sentinel lymph node biopsy. Final pathology revealing a 0.3 cm disease sentinel nodes were negative, ER/PR positive HER-2 negative diagnosed November 2013 status post radiation and tamoxifen started April 2014  Tamoxifen toxicities: 1. Occasional hot flashes Patient gets annual GYN checkups  Breast cancer surveillance: 1. Mammograms to be done June 2016 I recommended 3-D mammogram. Mammogram in June 2015 showed breast density category C. 2. Physical exam of the breasts 05/24/2014 is normal.  Hot flashes: Continue with Effexor 37.5 daily.  Return to clinic in 6 months for follow-up.

## 2014-05-31 ENCOUNTER — Other Ambulatory Visit: Payer: Self-pay | Admitting: Hematology and Oncology

## 2014-05-31 DIAGNOSIS — Z853 Personal history of malignant neoplasm of breast: Secondary | ICD-10-CM

## 2014-06-18 ENCOUNTER — Other Ambulatory Visit (HOSPITAL_COMMUNITY)
Admission: RE | Admit: 2014-06-18 | Discharge: 2014-06-18 | Disposition: A | Discharge status: Home / Self Care | Payer: 59 | Source: Ambulatory Visit | Attending: Gynecology | Admitting: Gynecology

## 2014-06-18 ENCOUNTER — Encounter: Payer: Self-pay | Admitting: Gynecology

## 2014-06-18 ENCOUNTER — Ambulatory Visit (INDEPENDENT_AMBULATORY_CARE_PROVIDER_SITE_OTHER): Payer: 59 | Admitting: Gynecology

## 2014-06-18 VITALS — BP 116/78 | Ht 66.0 in | Wt 159.0 lb

## 2014-06-18 DIAGNOSIS — Z01419 Encounter for gynecological examination (general) (routine) without abnormal findings: Secondary | ICD-10-CM | POA: Diagnosis not present

## 2014-06-18 DIAGNOSIS — Z8639 Personal history of other endocrine, nutritional and metabolic disease: Secondary | ICD-10-CM | POA: Diagnosis not present

## 2014-06-18 DIAGNOSIS — M8580 Other specified disorders of bone density and structure, unspecified site: Secondary | ICD-10-CM | POA: Diagnosis not present

## 2014-06-18 DIAGNOSIS — Z853 Personal history of malignant neoplasm of breast: Secondary | ICD-10-CM

## 2014-06-18 DIAGNOSIS — Z1151 Encounter for screening for human papillomavirus (HPV): Secondary | ICD-10-CM | POA: Insufficient documentation

## 2014-06-18 LAB — CBC WITH DIFFERENTIAL/PLATELET
BASOS PCT: 1 % (ref 0–1)
Basophils Absolute: 0 10*3/uL (ref 0.0–0.1)
EOS ABS: 0.1 10*3/uL (ref 0.0–0.7)
Eosinophils Relative: 4 % (ref 0–5)
HEMATOCRIT: 39.2 % (ref 36.0–46.0)
HEMOGLOBIN: 12.9 g/dL (ref 12.0–15.0)
Lymphocytes Relative: 30 % (ref 12–46)
Lymphs Abs: 0.9 10*3/uL (ref 0.7–4.0)
MCH: 29.1 pg (ref 26.0–34.0)
MCHC: 32.9 g/dL (ref 30.0–36.0)
MCV: 88.3 fL (ref 78.0–100.0)
MONOS PCT: 7 % (ref 3–12)
MPV: 10.7 fL (ref 8.6–12.4)
Monocytes Absolute: 0.2 10*3/uL (ref 0.1–1.0)
NEUTROS PCT: 58 % (ref 43–77)
Neutro Abs: 1.8 10*3/uL (ref 1.7–7.7)
PLATELETS: 211 10*3/uL (ref 150–400)
RBC: 4.44 MIL/uL (ref 3.87–5.11)
RDW: 13.5 % (ref 11.5–15.5)
WBC: 3.1 10*3/uL — AB (ref 4.0–10.5)

## 2014-06-18 LAB — COMPREHENSIVE METABOLIC PANEL
ALBUMIN: 3.7 g/dL (ref 3.5–5.2)
ALT: 12 U/L (ref 0–35)
AST: 16 U/L (ref 0–37)
Alkaline Phosphatase: 40 U/L (ref 39–117)
BUN: 11 mg/dL (ref 6–23)
CALCIUM: 8.9 mg/dL (ref 8.4–10.5)
CHLORIDE: 100 meq/L (ref 96–112)
CO2: 29 mEq/L (ref 19–32)
CREATININE: 0.88 mg/dL (ref 0.50–1.10)
GLUCOSE: 84 mg/dL (ref 70–99)
POTASSIUM: 3.7 meq/L (ref 3.5–5.3)
Sodium: 135 mEq/L (ref 135–145)
Total Bilirubin: 0.5 mg/dL (ref 0.2–1.2)
Total Protein: 6.1 g/dL (ref 6.0–8.3)

## 2014-06-18 LAB — LIPID PANEL
CHOL/HDL RATIO: 3.3 ratio
Cholesterol: 233 mg/dL — ABNORMAL HIGH (ref 0–200)
HDL: 70 mg/dL (ref >=46)
LDL CALC: 139 mg/dL — AB (ref 0–99)
Triglycerides: 119 mg/dL (ref <150)
VLDL: 24 mg/dL (ref 0–40)

## 2014-06-18 LAB — TSH: TSH: 16.094 u[IU]/mL — AB (ref 0.350–4.500)

## 2014-06-18 NOTE — Progress Notes (Addendum)
Tina Parks 01/09/1971 [id number] Patient is a 44 year old who presented to the office today for her annual gynecological examination with no complaints today.Patient with prior history of left lumpectomy as a result of screening mammogram having noted a suspicious lesion. 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 Recommendations for a left lumpectomy with sentinel lymph node biopsy. She underwent this on 02/21/2012. Her final pathology revealed a 0.3 cm invasive ductal carcinoma that was grade 1. No lymphovascular invasion margins were -2 sentinel nodes were negative for metastatic disease tumor was ER +100% PR +100% HER-2/neu negative with a Ki-67 3%.   patient had genetic testing and counseling performed. She had a gene BX panel performed for breast and ovarian cancer panel. 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 She was also found to have a variant FAM175a, c.1139A>G (p.Asp380Gly). The patient was negative for BRCA1 and BRCA2 gene mutation.  Patient completed radiation therapy and is now on tamoxifen. Patient's husband had a vasectomy. Patient is having normal menstrual cycles.Patient with past history of CIN-1 and 2005 subsequent Pap smears been normal. She had a bone density study done in 2011 secondary to stress fracture of the left hip and family history of osteoporosis her lowest Z. Score was at the right femoral neck with a value -1.2.  Past medical history,surgical history, family history and social history were all reviewed and documented in the EPIC chart.  Gynecologic History Patient's last menstrual period was 06/05/2014. Contraception: vasectomy Last Pap: 2013. Results were: normal Last mammogram: 2015. Results were: normal  Obstetric History OB History  Gravida Para Term Preterm AB SAB TAB Ectopic Multiple Living  1 1 1       1     #  Outcome Date GA Lbr Len/2nd Weight Sex Delivery Anes PTL Lv  1 Term             Obstetric Comments  G1, P1, NuvaRing 1990-2013     ROS: A ROS was performed and pertinent positives and negatives are included in the history.  GENERAL: No fevers or chills. HEENT: No change in vision, no earache, sore throat or sinus congestion. NECK: No pain or stiffness. CARDIOVASCULAR: No chest pain or pressure. No palpitations. PULMONARY: No shortness of breath, cough or wheeze. GASTROINTESTINAL: No abdominal pain, nausea, vomiting or diarrhea, melena or bright red blood per rectum. GENITOURINARY: No urinary frequency, urgency, hesitancy or dysuria. MUSCULOSKELETAL: No joint or muscle pain, no back pain, no recent trauma. DERMATOLOGIC: No rash, no itching, no lesions. ENDOCRINE: No polyuria, polydipsia, no heat or cold intolerance. No recent change in weight. HEMATOLOGICAL: No anemia or easy bruising or bleeding. NEUROLOGIC: No headache, seizures, numbness, tingling or weakness. PSYCHIATRIC: No depression, no loss of interest in normal activity or change in sleep pattern.     Exam: chaperone present  BP 116/78 mmHg  Ht 5' 6"  (1.676 m)  Wt 159 lb (72.122 kg)  BMI 25.68 kg/m2  LMP 06/05/2014  Body mass index is 25.68 kg/(m^2).  General appearance : Well developed well nourished female. No acute distress HEENT: Eyes: no retinal hemorrhage or exudates,  Neck supple, trachea midline, no carotid bruits, no thyroidmegaly Lungs: Clear to auscultation, no rhonchi or wheezes, or rib retractions  Heart: Regular rate and rhythm, no murmurs or gallops Breast:Examined in sitting and supine position were symmetrical in appearance, no palpable masses or tenderness,  no skin retraction, no nipple inversion, no nipple discharge, no skin discoloration, no axillary or supraclavicular lymphadenopathy Abdomen: no palpable masses or tenderness, no rebound or guarding Extremities: no edema or skin discoloration or  tenderness  Pelvic:  Bartholin, Urethra, Skene Glands: Within normal limits             Vagina: No gross lesions or discharge  Cervix: No gross lesions or discharge  Uterus  anteverted, normal size, shape and consistency, non-tender and mobile  Adnexa  Without masses or tenderness  Anus and perineum  normal   Rectovaginal  normal sphincter tone without palpated masses or tenderness             Hemoccult not indicated    Assessment/Plan:  44 y.o. 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 Patient is doing well. She was asymptomatic today. She is fasting today the following screening labs will be ordered: Fasting lipid profile, comprehensive metabolic panel, TSH, CBC, and urinalysis. Pap smear was done today. Patient scheduled for mammogram next month. We discussed importance of calcium vitamin D and regular exercise for osteoporosis prevention. Patient will need a bone density study next year. She stated that she had one done recently had some health fair and will bring [place] a copy of the results of that we can scan and place as part of her record. Because of patient's past history vitamin D deficiency vitamin D level will be checked today as well   Terrance Mass MD, 12:12 PM 06/18/2014

## 2014-06-19 LAB — URINALYSIS W MICROSCOPIC + REFLEX CULTURE
BACTERIA UA: NONE SEEN
Bilirubin Urine: NEGATIVE
CASTS: NONE SEEN
Crystals: NONE SEEN
GLUCOSE, UA: NEGATIVE mg/dL
Hgb urine dipstick: NEGATIVE
KETONES UR: NEGATIVE mg/dL
LEUKOCYTES UA: NEGATIVE
NITRITE: NEGATIVE
Protein, ur: NEGATIVE mg/dL
SPECIFIC GRAVITY, URINE: 1.017 (ref 1.005–1.030)
Squamous Epithelial / LPF: NONE SEEN
Urobilinogen, UA: 0.2 mg/dL (ref 0.0–1.0)
pH: 6 (ref 5.0–8.0)

## 2014-06-19 LAB — VITAMIN D 25 HYDROXY (VIT D DEFICIENCY, FRACTURES): Vit D, 25-Hydroxy: 14 ng/mL — ABNORMAL LOW (ref 30–100)

## 2014-06-21 ENCOUNTER — Other Ambulatory Visit: Payer: Self-pay | Admitting: Gynecology

## 2014-06-21 DIAGNOSIS — E559 Vitamin D deficiency, unspecified: Secondary | ICD-10-CM

## 2014-06-21 DIAGNOSIS — R7989 Other specified abnormal findings of blood chemistry: Secondary | ICD-10-CM

## 2014-06-21 MED ORDER — VITAMIN D (ERGOCALCIFEROL) 1.25 MG (50000 UNIT) PO CAPS
50000.0000 [IU] | ORAL_CAPSULE | ORAL | Status: DC
Start: 2014-06-21 — End: 2016-06-20

## 2014-06-22 ENCOUNTER — Telehealth: Payer: Self-pay | Admitting: *Deleted

## 2014-06-22 ENCOUNTER — Other Ambulatory Visit: Payer: Self-pay | Admitting: Hematology and Oncology

## 2014-06-22 DIAGNOSIS — Z9889 Other specified postprocedural states: Secondary | ICD-10-CM

## 2014-06-22 NOTE — Telephone Encounter (Signed)
Referral faxed to [facility] they will contact pt to schedule.

## 2014-06-22 NOTE — Telephone Encounter (Signed)
-----   Message from Ramond Craver, [place] sent at 06/21/2014  3:35 PM EDT ----- Regarding: referral to hematology Per Dr. Moshe Salisbury "1. I would like to refer her to the hematologist oncologist because of her persistent low WBC and will need further evaluation."  I already spoke with patient. Thanks!

## 2014-06-23 ENCOUNTER — Telehealth: Payer: Self-pay | Admitting: Hematology and Oncology

## 2014-06-23 LAB — CYTOLOGY - PAP

## 2014-06-23 NOTE — Telephone Encounter (Signed)
LEFT MESSAGE FOR PATIENT TO RETURN CALL

## 2014-06-24 ENCOUNTER — Telehealth: Payer: Self-pay | Admitting: Hematology and Oncology

## 2014-06-24 NOTE — Telephone Encounter (Signed)
s/w patient and gave appt for 05/27 @ 11:45 w/Dr. Lindi Adie

## 2014-06-25 ENCOUNTER — Other Ambulatory Visit: Payer: 59 | Admitting: Radiology

## 2014-06-25 ENCOUNTER — Ambulatory Visit (HOSPITAL_BASED_OUTPATIENT_CLINIC_OR_DEPARTMENT_OTHER): Payer: 59 | Admitting: Hematology and Oncology

## 2014-06-25 VITALS — BP 124/78 | HR 77 | Temp 98.2°F | Resp 18 | Wt 159.6 lb

## 2014-06-25 DIAGNOSIS — Z853 Personal history of malignant neoplasm of breast: Secondary | ICD-10-CM

## 2014-06-25 DIAGNOSIS — C50212 Malignant neoplasm of upper-inner quadrant of left female breast: Secondary | ICD-10-CM | POA: Diagnosis not present

## 2014-06-25 DIAGNOSIS — R946 Abnormal results of thyroid function studies: Secondary | ICD-10-CM | POA: Diagnosis not present

## 2014-06-25 DIAGNOSIS — D72819 Decreased white blood cell count, unspecified: Secondary | ICD-10-CM | POA: Diagnosis not present

## 2014-06-25 DIAGNOSIS — R7989 Other specified abnormal findings of blood chemistry: Secondary | ICD-10-CM

## 2014-06-25 HISTORY — DX: Decreased white blood cell count, unspecified: D72.819

## 2014-06-25 LAB — CBC WITH DIFFERENTIAL/PLATELET
BASOS PCT: 0 % (ref 0–1)
Basophils Absolute: 0 10*3/uL (ref 0.0–0.1)
EOS ABS: 0.1 10*3/uL (ref 0.0–0.7)
EOS PCT: 2 % (ref 0–5)
HCT: 38.9 % (ref 36.0–46.0)
Hemoglobin: 13.1 g/dL (ref 12.0–15.0)
LYMPHS PCT: 39 % (ref 12–46)
Lymphs Abs: 1.8 10*3/uL (ref 0.7–4.0)
MCH: 29.2 pg (ref 26.0–34.0)
MCHC: 33.7 g/dL (ref 30.0–36.0)
MCV: 86.6 fL (ref 78.0–100.0)
MPV: 11.5 fL (ref 8.6–12.4)
Monocytes Absolute: 0.3 10*3/uL (ref 0.1–1.0)
Monocytes Relative: 6 % (ref 3–12)
NEUTROS PCT: 53 % (ref 43–77)
Neutro Abs: 2.5 10*3/uL (ref 1.7–7.7)
Platelets: 225 10*3/uL (ref 150–400)
RBC: 4.49 MIL/uL (ref 3.87–5.11)
RDW: 13.6 % (ref 11.5–15.5)
WBC: 4.7 10*3/uL (ref 4.0–10.5)

## 2014-06-25 LAB — THYROID PANEL WITH TSH
FREE THYROXINE INDEX: 0.6 — AB (ref 1.4–3.8)
T3 Uptake: 21 % — ABNORMAL LOW (ref 22–35)
T4 TOTAL: 2.8 ug/dL — AB (ref 4.5–12.0)
TSH: 60.338 u[IU]/mL — AB (ref 0.350–4.500)

## 2014-06-25 NOTE — Telephone Encounter (Signed)
Appointment 06/25/14 @ 11:45am

## 2014-06-25 NOTE — Addendum Note (Signed)
Addended by: Renford Dills on: 06/25/2014 12:48 PM   Modules accepted: Medications

## 2014-06-25 NOTE — Assessment & Plan Note (Signed)
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 Final pathology revealing a 0.3 cm disease sentinel nodes were negative, ER/PR positive HER-2 negative diagnosed November 2013 status post radiation and tamoxifen started April 2014  Tamoxifen toxicities: 1. Occasional hot flashes Patient gets annual GYN checkups  Breast cancer surveillance: 1. Mammograms to be done June 2016 I recommended 3-D mammogram. Mammogram in June 2015 showed breast density category C. 2. Physical exam of the breasts 05/24/2014 is normal.  Hot flashes: Continue with Effexor 37.5 daily. 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

## 2014-06-25 NOTE — Progress Notes (Signed)
Patient Care Team: Bernita Buffy, PA-C as PCP - General (Internal Medicine) Neldon Mc, MD as Surgeon (General Surgery) Thea Silversmith, MD (Radiation Oncology) Eston Esters, MD (Hematology and Oncology)  DIAGNOSIS: Primary cancer of upper inner quadrant of left female breast   Staging form: Breast, AJCC 7th Edition     Clinical stage from 12/18/2011: Stage IA (T76mc, N0, cM0) - Signed by [name] on 12/18/2011     Pathologic: Stage IA (T1a, N0, cM0) - Signed by [name] on 03/11/2012   SUMMARY OF ONCOLOGIC HISTORY:   Primary cancer of upper inner quadrant of left female breast   02/21/2012 Surgery left breast lumpectomy: 0.3 cm invasive ductal carcinoma grade 1, 0/2 sentinel nodes, ER 100%, PR 100%, HER-2 negative, Ki-67 3%    Procedure gene DX panel performed for breast and ovarian cancer panel. 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 She was also found to have a variant FAM175a, c.1139A>G (p.Asp380Gly   04/01/2012 - 04/28/2012 Radiation Therapy adjuvant radiation therapy with Dr. WPablo Ledger  05/14/2012 -  Anti-estrogen oral therapy tamoxifen 20 mg daily    CHIEF COMPLIANT: New diagnosis of leukopenia  INTERVAL HISTORY: Tina ALAMILLOis a 44year old with above-mentioned history of left breast cancer who is doing well on tamoxifen therapy. She had a recent blood work that showed a white count of 3.1 with a normal differential. Previously her white count had been 3.7 and 3.8 and 2 years ago it was normal. She was also diagnosed with hypothyroidism recently. She is undergoing further workup for the thyroid. She reports that she does get tired at the end of the day but she attributed that to working hard. She does not have any fevers chills night sweats or weight loss. Does not have any enlarged lymph nodes. She does not have any recent infections or has been very healthy otherwise. There has not  been any new medications and recently started.  REVIEW OF SYSTEMS:   Constitutional: Denies fevers, chills or abnormal weight loss Eyes: Denies blurriness of vision Ears, nose, mouth, throat, and face: Denies mucositis or sore throat Respiratory: Denies cough, dyspnea or wheezes Cardiovascular: Denies palpitation, chest discomfort or lower extremity swelling Gastrointestinal:  Denies nausea, heartburn or change in bowel habits Skin: Denies abnormal skin rashes Lymphatics: Denies new lymphadenopathy or easy bruising Neurological:Denies numbness, tingling or new weaknesses Behavioral/Psych: Mood is stable, no new changes  All other systems were reviewed with the patient and are negative.  I have reviewed the past medical history, past surgical history, social history and family history with the patient and they are unchanged from previous note.  ALLERGIES:  has No Known Allergies.  MEDICATIONS:  Current Outpatient Prescriptions  Medication Sig Dispense Refill  . ALPRAZolam (XANAX) 1 MG tablet Take 1 mg by mouth at bedtime as needed. sleep    . montelukast (SINGULAIR) 10 MG tablet Take 1 tablet (10 mg total) by mouth daily. 90 tablet 3  . tamoxifen (NOLVADEX) 20 MG tablet TAKE 1 TABLET BY MOUTH ONCE DAILY 90 tablet 1  . valACYclovir (VALTREX) 1000 MG tablet Take 2 tablets (2,000 mg total) by mouth as needed. For fever blister 30 tablet 12  . venlafaxine XR (EFFEXOR-XR) 37.5 MG 24 hr capsule TAKE 1 CAPSULE BY MOUTH ONCE DAILY 90 capsule 0  . Vitamin D, Ergocalciferol, (DRISDOL) 50000 UNITS CAPS capsule Take 1 capsule (50,000 Units total) by mouth every  7 (seven) days. 12 capsule 0   No current facility-administered medications for this visit.    PHYSICAL EXAMINATION: ECOG PERFORMANCE STATUS: 0 - Asymptomatic  Filed Vitals:   06/25/14 1156  BP: 124/78  Pulse: 77  Temp: 98.2 F (36.8 C)  Resp: 18   Filed Weights   06/25/14 1156  Weight: 159 lb 9 oz (72.377 kg)     GENERAL:alert, no distress and comfortable SKIN: skin color, texture, turgor are normal, no rashes or significant lesions EYES: normal, Conjunctiva are pink and non-injected, sclera clear OROPHARYNX:no exudate, no erythema and lips, buccal mucosa, and tongue normal  NECK: supple, thyroid normal size, non-tender, without nodularity LYMPH:  no palpable lymphadenopathy in the cervical, axillary or inguinal LUNGS: clear to auscultation and percussion with normal breathing effort HEART: regular rate & rhythm and no murmurs and no lower extremity edema ABDOMEN:abdomen soft, non-tender and normal bowel sounds Musculoskeletal:no cyanosis of digits and no clubbing  NEURO: alert & oriented x 3 with fluent speech, no focal motor/sensory deficits   LABORATORY DATA:  I have reviewed the data as listed   Chemistry      Component Value Date/Time   NA 135 06/18/2014 1215   NA 140 11/24/2013 1335   K 3.7 06/18/2014 1215   K 3.6 11/24/2013 1335   CL 100 06/18/2014 1215   CL 102 05/19/2012 1431   CO2 29 06/18/2014 1215   CO2 27 11/24/2013 1335   BUN 11 06/18/2014 1215   BUN 10.3 11/24/2013 1335   CREATININE 0.88 06/18/2014 1215   CREATININE 0.9 11/24/2013 1335   CREATININE 0.71 02/21/2012 1250      Component Value Date/Time   CALCIUM 8.9 06/18/2014 1215   CALCIUM 9.2 11/24/2013 1335   ALKPHOS 40 06/18/2014 1215   ALKPHOS 43 11/24/2013 1335   AST 16 06/18/2014 1215   AST 15 11/24/2013 1335   ALT 12 06/18/2014 1215   ALT 14 11/24/2013 1335   BILITOT 0.5 06/18/2014 1215   BILITOT 0.27 11/24/2013 1335       Lab Results  Component Value Date   WBC 3.1* 06/18/2014   HGB 12.9 06/18/2014   HCT 39.2 06/18/2014   MCV 88.3 06/18/2014   PLT 211 06/18/2014   NEUTROABS 1.8 06/18/2014    ASSESSMENT & PLAN:  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 Final  pathology revealing a 0.3 cm disease sentinel nodes were negative, ER/PR positive HER-2 negative diagnosed November 2013 status post radiation and tamoxifen started April 2014  Tamoxifen toxicities: 1. Occasional hot flashes Patient gets annual GYN checkups  Breast cancer surveillance: 1. Mammograms to be done June 2016 I recommended 3-D mammogram. Mammogram in June 2015 showed breast density category C. 2. Physical exam of the breasts 05/24/2014 is normal.  Hot flashes: Continue with Effexor 37.5 daily. 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 DD: 1. Autoimmune causes: check ANA. With the recent diagnosis of hypothyroidism I also suspect that she needs to be looked for Hashimoto's thyroiditis. I will order anti-thyroid peroxidase antibody. 2. Check N-17 and folic acid since occasionally deficiency of these vitamins can lead to leukopenia 3. Medications could be a cause: Tamoxifen has rarely been found to be associated with leukopenia. I  reviewed her remaining medications and they're not likely to cause leukopenia.  Patient does not have any viral illnesses and any other sickness. We will plan to approach this conservatively. Once her thyroid function improves her blood counts might improve slowly.  If her ANA and A-56 and folic acid levels come back normal, We will keep her current follow-up appointment in 4 months and recheck her labs at that time as well. Recheck CBCD with the rest of the labs.  If the ANC drops below 1000 and if she notes other cytopenias, she will need a bone marrow biopsy.     Orders Placed This Encounter  Procedures  . Thyroid peroxidase antibody    Standing Status: Future     Number of Occurrences:      Standing Expiration Date: 06/25/2015  . Vitamin B12    Standing Status: Future     Number of  Occurrences:      Standing Expiration Date: 06/25/2015  . Folate, Serum    Standing Status: Future     Number of Occurrences:      Standing Expiration Date: 06/25/2015  . ANA w/Reflex if Positive    Standing Status: Future     Number of Occurrences:      Standing Expiration Date: 07/30/2015  . CBC with Differential    Standing Status: Future     Number of Occurrences:      Standing Expiration Date: 06/25/2015   The patient has a good understanding of the overall plan. she agrees with it. she will call with any problems that may develop before the next visit here.   Rulon Eisenmenger, MD

## 2014-06-25 NOTE — Assessment & Plan Note (Addendum)
Leucopenia: Recent labs done 7 days ago showed a WBC 3.1 with ANC 1.8; Remainder of cels were normal. Differential diagnosis: 1. Autoimmune causes: check ANA. With the recent diagnosis of hypothyroidism I also suspect that she needs to be looked for Hashimoto's thyroiditis. I will order anti-thyroid peroxidase antibody. 2. Check Q-24 and folic acid since occasionally deficiency of these vitamins can lead to leukopenia 3. Medications could be a cause: Tamoxifen has rarely been found to be associated with leukopenia Patient does not have any viral illnesses and any other sickness.  Plan: We will plan to approach this conservatively. Once her thyroid function improves her blood counts might improve slowly.  If her ANA and V-14 and folic acid levels come back normal, We will keep her current follow-up appointment in 4 months and recheck her labs at that time as well. Recheck CBCD with the rest of the labs.  If the ANC drops below 1000 and if she notes other cytopenias, she will need a bone marrow biopsy.

## 2014-06-26 LAB — THYROID PEROXIDASE ANTIBODY: Thyroperoxidase Ab SerPl-aCnc: 520 IU/mL — ABNORMAL HIGH (ref <9)

## 2014-06-29 LAB — ANA: Anti Nuclear Antibody(ANA): NEGATIVE

## 2014-07-01 ENCOUNTER — Other Ambulatory Visit: Payer: Self-pay | Admitting: Physician Assistant

## 2014-07-01 DIAGNOSIS — E063 Autoimmune thyroiditis: Secondary | ICD-10-CM | POA: Insufficient documentation

## 2014-07-01 MED ORDER — SYNTHROID 50 MCG PO TABS: 50.0000 ug | ORAL_TABLET | Freq: Every day | ORAL | Status: DC

## 2014-07-02 ENCOUNTER — Ambulatory Visit
Admission: RE | Admit: 2014-07-02 | Discharge: 2014-07-02 | Disposition: A | Discharge status: Home / Self Care | Payer: 59 | Source: Ambulatory Visit | Attending: Hematology and Oncology | Admitting: Hematology and Oncology

## 2014-07-02 DIAGNOSIS — Z853 Personal history of malignant neoplasm of breast: Secondary | ICD-10-CM

## 2014-07-02 DIAGNOSIS — Z9889 Other specified postprocedural states: Secondary | ICD-10-CM

## 2014-07-30 ENCOUNTER — Other Ambulatory Visit: Payer: Self-pay | Admitting: Hematology and Oncology

## 2014-07-30 MED ORDER — VENLAFAXINE HCL ER 37.5 MG PO CP24: 37.5000 mg | ORAL_CAPSULE | Freq: Every day | ORAL | Status: DC

## 2014-07-30 NOTE — Addendum Note (Signed)
Addended by: Prentiss Bells on: 07/30/2014 06:23 PM   Modules accepted: Orders

## 2014-08-15 ENCOUNTER — Other Ambulatory Visit (INDEPENDENT_AMBULATORY_CARE_PROVIDER_SITE_OTHER): Payer: 59 | Admitting: *Deleted

## 2014-08-15 DIAGNOSIS — E063 Autoimmune thyroiditis: Secondary | ICD-10-CM

## 2014-08-15 LAB — TSH: TSH: 17.336 u[IU]/mL — AB (ref 0.350–4.500)

## 2014-08-17 ENCOUNTER — Other Ambulatory Visit: Payer: Self-pay | Admitting: Physician Assistant

## 2014-08-17 DIAGNOSIS — E039 Hypothyroidism, unspecified: Secondary | ICD-10-CM

## 2014-08-17 DIAGNOSIS — E063 Autoimmune thyroiditis: Secondary | ICD-10-CM

## 2014-08-17 MED ORDER — LEVOTHYROXINE SODIUM 75 MCG PO TABS: 75.0000 ug | ORAL_TABLET | Freq: Every day | ORAL | Status: DC

## 2014-09-29 ENCOUNTER — Other Ambulatory Visit (INDEPENDENT_AMBULATORY_CARE_PROVIDER_SITE_OTHER): Payer: 59

## 2014-09-29 DIAGNOSIS — E063 Autoimmune thyroiditis: Secondary | ICD-10-CM

## 2014-09-29 LAB — TSH: TSH: 2.723 u[IU]/mL (ref 0.350–4.500)

## 2014-09-29 NOTE — Progress Notes (Signed)
Pt is for lab work only.

## 2014-10-13 ENCOUNTER — Other Ambulatory Visit: Payer: Self-pay | Admitting: Family Medicine

## 2014-10-13 ENCOUNTER — Other Ambulatory Visit: Payer: Self-pay | Admitting: Hematology and Oncology

## 2014-10-13 DIAGNOSIS — E063 Autoimmune thyroiditis: Secondary | ICD-10-CM

## 2014-10-13 NOTE — Telephone Encounter (Signed)
Last OV 06/25/14.  Next OV 11/24/14.  Chart reviewed.

## 2014-10-14 MED ORDER — SYNTHROID 75 MCG PO TABS: 75.0000 ug | ORAL_TABLET | Freq: Every day | ORAL | Status: DC

## 2014-10-14 NOTE — Telephone Encounter (Signed)
Brien Few asked me for RFs of these two meds. I DCd the old strength of Synthroid (50 mcg) and pended current dose along with singulair. It has been a long time since she has been seen so need to run the RFs by you for approval. Thanks! She would like to p/up today along w/other meds that are ready.

## 2014-10-25 ENCOUNTER — Encounter: Payer: Self-pay | Admitting: Urgent Care

## 2014-11-23 NOTE — Assessment & Plan Note (Signed)
Leucopenia: Recent labs done 7 days ago showed a WBC 3.1 with ANC 1.8; Remainder of cels were normal.  DD:  1. Autoimmune causes: check ANA. With the recent diagnosis of hypothyroidism I also suspect that she needs to be looked for Hashimoto's thyroiditis. I will order anti-thyroid peroxidase antibody.  2. Check U-31 and folic acid since occasionally deficiency of these vitamins can lead to leukopenia  3. Medications could be a cause: Tamoxifen has rarely been found to be associated with leukopenia. I reviewed her remaining medications and they're not likely to cause leukopenia.  Patient does not have any viral illnesses and any other sickness.  We will plan to approach this conservatively. Once her thyroid function improves her blood counts might improve slowly.  If her ANA and S-97 and folic acid levels come back normal, We will keep her current follow-up appointment in 4 months and recheck her labs at that time as well. Recheck CBCD with the rest of the labs.  If the ANC drops below 1000 and if she notes other cytopenias, she will need a bone marrow biopsy.    RTC in 6 months

## 2014-11-24 ENCOUNTER — Other Ambulatory Visit (HOSPITAL_BASED_OUTPATIENT_CLINIC_OR_DEPARTMENT_OTHER): Payer: 59

## 2014-11-24 ENCOUNTER — Telehealth: Payer: Self-pay | Admitting: Hematology and Oncology

## 2014-11-24 ENCOUNTER — Other Ambulatory Visit: Payer: Self-pay | Admitting: *Deleted

## 2014-11-24 ENCOUNTER — Ambulatory Visit (HOSPITAL_BASED_OUTPATIENT_CLINIC_OR_DEPARTMENT_OTHER): Payer: 59 | Admitting: Hematology and Oncology

## 2014-11-24 ENCOUNTER — Encounter: Payer: Self-pay | Admitting: Hematology and Oncology

## 2014-11-24 VITALS — BP 132/75 | HR 84 | Temp 98.7°F | Resp 17 | Ht 66.0 in | Wt 167.6 lb

## 2014-11-24 DIAGNOSIS — R7989 Other specified abnormal findings of blood chemistry: Secondary | ICD-10-CM

## 2014-11-24 DIAGNOSIS — D72819 Decreased white blood cell count, unspecified: Secondary | ICD-10-CM

## 2014-11-24 DIAGNOSIS — C50212 Malignant neoplasm of upper-inner quadrant of left female breast: Secondary | ICD-10-CM

## 2014-11-24 LAB — CBC WITH DIFFERENTIAL/PLATELET
BASO%: 0.5 % (ref 0.0–2.0)
Basophils Absolute: 0 10*3/uL (ref 0.0–0.1)
EOS%: 1.5 % (ref 0.0–7.0)
Eosinophils Absolute: 0 10*3/uL (ref 0.0–0.5)
HCT: 37.3 % (ref 34.8–46.6)
HGB: 12.4 g/dL (ref 11.6–15.9)
LYMPH%: 27.3 % (ref 14.0–49.7)
MCH: 29.8 pg (ref 25.1–34.0)
MCHC: 33.2 g/dL (ref 31.5–36.0)
MCV: 89.8 fL (ref 79.5–101.0)
MONO#: 0.2 10*3/uL (ref 0.1–0.9)
MONO%: 6.8 % (ref 0.0–14.0)
NEUT%: 63.9 % (ref 38.4–76.8)
NEUTROS ABS: 2.1 10*3/uL (ref 1.5–6.5)
Platelets: 169 10*3/uL (ref 145–400)
RBC: 4.16 10*6/uL (ref 3.70–5.45)
RDW: 12.9 % (ref 11.2–14.5)
WBC: 3.3 10*3/uL — AB (ref 3.9–10.3)
lymph#: 0.9 10*3/uL (ref 0.9–3.3)

## 2014-11-24 LAB — COMPREHENSIVE METABOLIC PANEL (CC13)
ALBUMIN: 3.6 g/dL (ref 3.5–5.0)
ALK PHOS: 39 U/L — AB (ref 40–150)
ALT: 9 U/L (ref 0–55)
AST: 11 U/L (ref 5–34)
Anion Gap: 5 mEq/L (ref 3–11)
BUN: 12 mg/dL (ref 7.0–26.0)
CALCIUM: 8.8 mg/dL (ref 8.4–10.4)
CO2: 26 mEq/L (ref 22–29)
CREATININE: 0.9 mg/dL (ref 0.6–1.1)
Chloride: 108 mEq/L (ref 98–109)
EGFR: 80 mL/min/{1.73_m2} — ABNORMAL LOW (ref >90)
GLUCOSE: 124 mg/dL (ref 70–140)
POTASSIUM: 4 meq/L (ref 3.5–5.1)
Sodium: 138 mEq/L (ref 136–145)
TOTAL PROTEIN: 6.2 g/dL — AB (ref 6.4–8.3)

## 2014-11-24 LAB — FOLATE: FOLATE: 11.7 ng/mL

## 2014-11-24 LAB — VITAMIN B12: Vitamin B-12: 304 pg/mL (ref 211–911)

## 2014-11-24 NOTE — Telephone Encounter (Signed)
Gave patient avs report and appointments for October 2017. °

## 2014-11-24 NOTE — Progress Notes (Signed)
Patient Care Team: Bernita Buffy, PA-C as PCP - General (Internal Medicine) Neldon Mc, MD as Surgeon (General Surgery) Thea Silversmith, MD (Radiation Oncology) Eston Esters, MD (Hematology and Oncology)  DIAGNOSIS: Primary cancer of upper inner quadrant of left female breast [facility])   Staging form: Breast, AJCC 7th Edition     Clinical stage from 12/18/2011: Stage IA (T20mc, N0, cM0) - Signed by [name] on 12/18/2011     Pathologic: Stage IA (T1a, N0, cM0) - Signed by [name] on 03/11/2012   SUMMARY OF ONCOLOGIC HISTORY:   [place]   02/21/2012 Surgery left breast lumpectomy: 0.3 cm invasive ductal carcinoma grade 1, 0/2 sentinel nodes, ER 100%, PR 100%, HER-2 negative, Ki-67 3%    Procedure gene DX panel performed for breast and ovarian cancer panel. 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 She was also found to have a variant FAM175a, c.1139A>G (p.Asp380Gly   04/01/2012 - 04/28/2012 Radiation Therapy adjuvant radiation therapy with Dr. WPablo Ledger  05/14/2012 -  Anti-estrogen oral therapy tamoxifen 20 mg daily    CHIEF COMPLIANT: Follow-up of leukopenia and on tamoxifen therapy for breast cancer  INTERVAL HISTORY: Tina Parks a 44year old with above-mentioned history of left breast cancer treated with lumpectomy and adjuvant radiation and is on tamoxifen therapy. She is tolerating tamoxifen extremely well without any major problems or concerns. She was found to be mildly leukopenic and workup was performed which did not reveal any clear abnormalities. ANA was normal. TSH was elevated and she was diagnosed with Hashimoto's thyroiditis. BX-83and folic acid were obtained today and are pending from today.  REVIEW OF SYSTEMS:   Constitutional: Denies fevers, chills or abnormal weight loss Eyes: Denies blurriness of vision Ears,  nose, mouth, throat, and face: Denies mucositis or sore throat Respiratory: Denies cough, dyspnea or wheezes Cardiovascular: Denies palpitation, chest discomfort or lower extremity swelling Gastrointestinal:  Denies nausea, heartburn or change in bowel habits Skin: Denies abnormal skin rashes Lymphatics: Denies new lymphadenopathy or easy bruising Neurological:Denies numbness, tingling or new weaknesses Behavioral/Psych: Mood is stable, no new changes  Breast:  denies any pain or lumps or nodules in either breasts All other systems were reviewed with the patient and are negative.  I have reviewed the past medical history, past surgical history, social history and family history with the patient and they are unchanged from previous note.  ALLERGIES:  has No Known Allergies.  MEDICATIONS:  Current Outpatient Prescriptions  Medication Sig Dispense Refill  . ALPRAZolam (XANAX) 1 MG tablet Take 1 mg by mouth at bedtime as needed. sleep    . montelukast (SINGULAIR) 10 MG tablet TAKE 1 TABLET BY MOUTH DAILY. 90 tablet 3  . SYNTHROID 75 MCG tablet Take 1 tablet (75 mcg total) by mouth daily before breakfast. 90 tablet 1  . tamoxifen (NOLVADEX) 20 MG tablet TAKE 1 TABLET BY MOUTH ONCE DAILY 90 tablet 1  . valACYclovir (VALTREX) 1000 MG tablet Take 2 tablets (2,000 mg total) by mouth as needed. For fever blister 30 tablet 12  . venlafaxine XR (EFFEXOR-XR) 37.5 MG 24 hr capsule TAKE 1 CAPSULE BY MOUTH DAILY. 90 capsule 0  . Vitamin D, Ergocalciferol, (DRISDOL) 50000 UNITS CAPS capsule Take 1 capsule (50,000 Units total) by mouth every 7 (seven) days. 12 capsule 0   No current facility-administered medications for this  visit.    PHYSICAL EXAMINATION: ECOG PERFORMANCE STATUS: 1 - Symptomatic but completely ambulatory  Filed Vitals:   11/24/14 1354  BP: 132/75  Pulse: 84  Temp: 98.7 F (37.1 C)  Resp: 17   Filed Weights   11/24/14 1354  Weight: 167 lb 9.6 oz (76.023 kg)     GENERAL:alert, no distress and comfortable SKIN: skin color, texture, turgor are normal, no rashes or significant lesions EYES: normal, Conjunctiva are pink and non-injected, sclera clear OROPHARYNX:no exudate, no erythema and lips, buccal mucosa, and tongue normal  NECK: supple, thyroid normal size, non-tender, without nodularity LYMPH:  no palpable lymphadenopathy in the cervical, axillary or inguinal LUNGS: clear to auscultation and percussion with normal breathing effort HEART: regular rate & rhythm and no murmurs and no lower extremity edema ABDOMEN:abdomen soft, non-tender and normal bowel sounds Musculoskeletal:no cyanosis of digits and no clubbing  NEURO: alert & oriented x 3 with fluent speech, no focal motor/sensory deficits BREAST: No palpable masses or nodules in either right or left breasts. No palpable axillary supraclavicular or infraclavicular adenopathy no breast tenderness or nipple discharge. (exam performed in the presence of a chaperone)  LABORATORY DATA:  I have reviewed the data as listed   Chemistry      Component Value Date/Time   NA 138 11/24/2014 1338   NA 135 06/18/2014 1215   K 4.0 11/24/2014 1338   K 3.7 06/18/2014 1215   CL 100 06/18/2014 1215   CL 102 05/19/2012 1431   CO2 26 11/24/2014 1338   CO2 29 06/18/2014 1215   BUN 12.0 11/24/2014 1338   BUN 11 06/18/2014 1215   CREATININE 0.9 11/24/2014 1338   CREATININE 0.88 06/18/2014 1215   CREATININE 0.71 02/21/2012 1250      Component Value Date/Time   CALCIUM 8.8 11/24/2014 1338   CALCIUM 8.9 06/18/2014 1215   ALKPHOS 39* 11/24/2014 1338   ALKPHOS 40 06/18/2014 1215   AST 11 11/24/2014 1338   AST 16 06/18/2014 1215   ALT 9 11/24/2014 1338   ALT 12 06/18/2014 1215   BILITOT <0.30 11/24/2014 1338   BILITOT 0.5 06/18/2014 1215       Lab Results  Component Value Date   WBC 3.3* 11/24/2014   HGB 12.4 11/24/2014   HCT 37.3 11/24/2014   MCV 89.8 11/24/2014   PLT 169 11/24/2014    NEUTROABS 2.1 11/24/2014   ASSESSMENT & PLAN:  Primary cancer of upper inner quadrant of left female breast Currently on tamoxifen tolerating it very well Breast Cancer Surveillance: 1. Breast exam 11/24/2014: Normal 2. Mammogram June 2016 No abnormalities. Postsurgical changes. Breast Density Category D. I recommended that she get 3-D mammograms for surveillance. Discussed the differences between different breast density categories.   Leukopenia: WBC 3.3 with ANC 2.1; Remainder of cels were normal.  DD:  1. Autoimmune causes: ANA Neg (patient has Hashimoto's thyroiditis)  2. Check Z-61 and folic acid: Pending from today   3. Medications could be a cause: Tamoxifen has rarely been found to be associated with leukopenia. I reviewed her remaining medications and they're not likely to cause leukopenia.  Patient does not have any viral illnesses and any other sickness.   RTC in  one year  No orders of the defined types were placed in this encounter.   The patient has a good understanding of the overall plan. she agrees with it. she will call with any problems that may develop before the next visit here.   Rulon Eisenmenger,  MD 11/24/2014

## 2015-01-10 ENCOUNTER — Other Ambulatory Visit: Payer: Self-pay | Admitting: Hematology and Oncology

## 2015-01-10 NOTE — Telephone Encounter (Signed)
Chart reviewed.

## 2015-01-13 ENCOUNTER — Ambulatory Visit (INDEPENDENT_AMBULATORY_CARE_PROVIDER_SITE_OTHER): Payer: 59 | Admitting: Physician Assistant

## 2015-01-13 ENCOUNTER — Encounter: Payer: Self-pay | Admitting: Physician Assistant

## 2015-01-13 DIAGNOSIS — E034 Atrophy of thyroid (acquired): Secondary | ICD-10-CM | POA: Diagnosis not present

## 2015-01-13 DIAGNOSIS — E038 Other specified hypothyroidism: Secondary | ICD-10-CM | POA: Diagnosis not present

## 2015-01-13 DIAGNOSIS — R0781 Pleurodynia: Secondary | ICD-10-CM | POA: Diagnosis not present

## 2015-01-13 DIAGNOSIS — C50212 Malignant neoplasm of upper-inner quadrant of left female breast: Secondary | ICD-10-CM

## 2015-01-13 DIAGNOSIS — R0789 Other chest pain: Secondary | ICD-10-CM | POA: Diagnosis not present

## 2015-01-13 LAB — CBC WITH DIFFERENTIAL/PLATELET
Basophils Absolute: 0 10*3/uL (ref 0.0–0.1)
Basophils Relative: 0 % (ref 0–1)
EOS ABS: 0.1 10*3/uL (ref 0.0–0.7)
EOS PCT: 2 % (ref 0–5)
HCT: 38.1 % (ref 36.0–46.0)
Hemoglobin: 12.9 g/dL (ref 12.0–15.0)
LYMPHS ABS: 1.5 10*3/uL (ref 0.7–4.0)
Lymphocytes Relative: 43 % (ref 12–46)
MCH: 29.9 pg (ref 26.0–34.0)
MCHC: 33.9 g/dL (ref 30.0–36.0)
MCV: 88.2 fL (ref 78.0–100.0)
MONOS PCT: 7 % (ref 3–12)
MPV: 10.5 fL (ref 8.6–12.4)
Monocytes Absolute: 0.2 10*3/uL (ref 0.1–1.0)
Neutro Abs: 1.6 10*3/uL — ABNORMAL LOW (ref 1.7–7.7)
Neutrophils Relative %: 48 % (ref 43–77)
PLATELETS: 204 10*3/uL (ref 150–400)
RBC: 4.32 MIL/uL (ref 3.87–5.11)
RDW: 13.4 % (ref 11.5–15.5)
WBC: 3.4 10*3/uL — ABNORMAL LOW (ref 4.0–10.5)

## 2015-01-13 LAB — TSH: TSH: 1.426 u[IU]/mL (ref 0.350–4.500)

## 2015-01-13 NOTE — Progress Notes (Signed)
Tina Parks  MRN: [id number] DOB: 24-Oct-1970  Subjective:  Pt presents to clinic pt presents to clinic with 2 pains that she is concerned about.  She started noticing that when she would sleep she was getting a pain in her left side, which is the side that she sleeps on.  It started about 4 weeks ago and really has not changed. She took some motrin and she was able to identify a small (fingertip size) area where the pain was located.  She only has pain when she pushed on the area either with her finger or by laying on it.  She does not have pain with breathing or movement.  She has not had trauma to the area.  She has changed her bra several times and that has not made a difference in the pain that she is having. 3-4 days ago she noticed that she has a similar pain on her left jaw bone.  A small area again that only hurts with palpation.  She is having no pain with chewing or moving her mouth.  She is of course worried because of her breast cancer diagnosis though she has seen the oncologist 10/26 with normal labs at that time.  She has no other bone pain in her back, hips or legs.  She otherwise feels good with normal amount of energy for her - no recent change in her activity level.  Patient Active Problem List   Diagnosis Date Noted  . Hashimoto's thyroiditis 07/01/2014  . Hypothyroidism 06/25/2014  . Leucopenia 06/25/2014  . Low bone mass 06/18/2014  . H/O vitamin D deficiency 06/18/2014  . Hx of radiation therapy   . Breast cancer ([place])   . 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 Primary cancer of upper inner quadrant of left female breast ([place]) 12/17/2011  . Recurrent sinusitis     Current Outpatient Prescriptions on File Prior to Visit  Medication Sig Dispense Refill  . ALPRAZolam (XANAX) 1 MG tablet Take 1 mg by mouth at bedtime as needed. sleep    . montelukast (SINGULAIR) 10 MG tablet TAKE 1 TABLET BY MOUTH DAILY. 90 tablet 3  . SYNTHROID 75 MCG  tablet Take 1 tablet (75 mcg total) by mouth daily before breakfast. 90 tablet 1  . tamoxifen (NOLVADEX) 20 MG tablet TAKE 1 TABLET BY MOUTH ONCE DAILY 90 tablet 1  . valACYclovir (VALTREX) 1000 MG tablet Take 2 tablets (2,000 mg total) by mouth as needed. For fever blister 30 tablet 12  . venlafaxine XR (EFFEXOR-XR) 37.5 MG 24 hr capsule TAKE 1 CAPSULE BY MOUTH DAILY. 90 capsule 3  . Vitamin D, Ergocalciferol, (DRISDOL) 50000 UNITS CAPS capsule Take 1 capsule (50,000 Units total) by mouth every 7 (seven) days. (Patient not taking: Reported on 01/13/2015) 12 capsule 0   No current facility-administered medications on file prior to visit.    No Known Allergies  Review of Systems  Respiratory: Negative for cough and shortness of breath.   Musculoskeletal: Negative for myalgias.   Objective:  BP 116/72 mmHg  Pulse 92  Temp(Src) 98.8 F (37.1 C) (Oral)  Resp 16  Ht 5' 6.5" (1.689 m)  Wt 163 lb 3.2 oz (74.027 kg)  BMI 25.95 kg/m2  SpO2 99%  LMP 12/02/2014  Physical Exam  Constitutional: She is oriented to person, place, and time and well-developed, well-nourished, and in no distress.  HENT:  Head: Normocephalic and atraumatic.    Right Ear: Hearing and external ear  normal.  Left Ear: Hearing and external ear normal.  Eyes: Conjunctivae are normal.  Neck: Normal range of motion.  Pulmonary/Chest: Effort normal.    The area of tenderness is 1/2cm from upper to lower and 1cm from side to side  Lymphadenopathy:       Head (right side): No tonsillar adenopathy present.       Head (left side): No tonsillar adenopathy present.    She has no cervical adenopathy.       Right cervical: No superficial cervical adenopathy present.      Left cervical: No superficial cervical adenopathy present.    She has no axillary adenopathy.  Neurological: She is alert and oriented to person, place, and time. Gait normal.  Skin: Skin is warm and dry.  Psychiatric: Mood, memory, affect and judgment  normal.  Vitals reviewed.   Assessment and Plan :  Left-sided chest wall pain - Plan: CBC with Differential/Platelet, Sedimentation rate  Hypothyroidism due to acquired atrophy of thyroid - Plan: TSH  Primary cancer of upper inner quadrant of left female breast ([place])   I am unsure what these pain are coming from.  We will check general labs (her CBC and CMET were normal 10/26 prior to the pain occuring).  I suspect this is musculoskeletal though it is possible this is part of a superficial lymph node chain in her chest wall - the area are not related to skin but do seem superficial in nature.  She will not manipulate the areas while we wait for the labs.  Her questions were answered and she agrees with the plan.  Windell Hummingbird PA-C  [facility] 01/13/2015 5:40 PM

## 2015-01-14 LAB — SEDIMENTATION RATE: Sed Rate: 4 mm/hr (ref 0–20)

## 2015-04-06 ENCOUNTER — Other Ambulatory Visit: Payer: Self-pay | Admitting: Hematology and Oncology

## 2015-04-06 MED FILL — SYNTHROID 75 MCG TABLET: 75 | 60 days supply | Qty: 60 | Fill #1

## 2015-04-06 MED FILL — MONTELUKAST SOD 10 MG TAB: 10 | 90 days supply | Qty: 90 | Fill #2

## 2015-04-06 MED FILL — VENLAFAXINE HCL ER 37.5 MG: 37.5 | 90 days supply | Qty: 90 | Fill #1

## 2015-04-07 ENCOUNTER — Other Ambulatory Visit: Payer: Self-pay | Admitting: Physician Assistant

## 2015-04-07 ENCOUNTER — Other Ambulatory Visit: Payer: Self-pay | Admitting: *Deleted

## 2015-04-07 DIAGNOSIS — C50212 Malignant neoplasm of upper-inner quadrant of left female breast: Secondary | ICD-10-CM

## 2015-04-07 MED ORDER — TAMOXIFEN CITRATE 20 MG PO TABS: 20.0000 mg | ORAL_TABLET | Freq: Every day | ORAL | Status: DC

## 2015-04-07 MED ORDER — FLUCONAZOLE 150 MG PO TABS: 150.0000 mg | ORAL_TABLET | Freq: Once | ORAL | Status: DC

## 2015-04-07 MED FILL — TAMOXIFEN 20 MG TABLET: 20 | 90 days supply | Qty: 90 | Fill #0

## 2015-04-07 MED FILL — FLUCONAZOLE 150 MG TABLET: 150 | 7 days supply | Qty: 2 | Fill #0

## 2015-05-17 DIAGNOSIS — H5213 Myopia, bilateral: Secondary | ICD-10-CM | POA: Diagnosis not present

## 2015-05-17 DIAGNOSIS — H52223 Regular astigmatism, bilateral: Secondary | ICD-10-CM | POA: Diagnosis not present

## 2015-05-17 DIAGNOSIS — H524 Presbyopia: Secondary | ICD-10-CM | POA: Diagnosis not present

## 2015-05-27 ENCOUNTER — Other Ambulatory Visit: Payer: Self-pay | Admitting: Hematology and Oncology

## 2015-05-27 DIAGNOSIS — Z853 Personal history of malignant neoplasm of breast: Secondary | ICD-10-CM

## 2015-06-03 ENCOUNTER — Other Ambulatory Visit: Payer: Self-pay | Admitting: Physician Assistant

## 2015-06-03 MED ORDER — SYNTHROID 75 MCG PO TABS: 75.0000 ug | ORAL_TABLET | Freq: Every day | ORAL | Status: DC

## 2015-06-03 MED FILL — SYNTHROID 75 MCG TABLET: 75 | 30 days supply | Qty: 30 | Fill #0

## 2015-06-20 ENCOUNTER — Encounter: Payer: Self-pay | Admitting: Gynecology

## 2015-06-20 ENCOUNTER — Ambulatory Visit (INDEPENDENT_AMBULATORY_CARE_PROVIDER_SITE_OTHER): Payer: 59 | Admitting: Gynecology

## 2015-06-20 VITALS — BP 110/70 | Ht 66.75 in | Wt 167.0 lb

## 2015-06-20 DIAGNOSIS — Z01419 Encounter for gynecological examination (general) (routine) without abnormal findings: Secondary | ICD-10-CM

## 2015-06-20 DIAGNOSIS — Z853 Personal history of malignant neoplasm of breast: Secondary | ICD-10-CM | POA: Diagnosis not present

## 2015-06-20 LAB — CBC WITH DIFFERENTIAL/PLATELET
BASOS ABS: 0 {cells}/uL (ref 0–200)
Basophils Relative: 0 %
EOS ABS: 100 {cells}/uL (ref 15–500)
EOS PCT: 4 %
HCT: 37.9 % (ref 35.0–45.0)
HEMOGLOBIN: 12.6 g/dL (ref 11.7–15.5)
Lymphocytes Relative: 43 %
Lymphs Abs: 1075 cells/uL (ref 850–3900)
MCH: 30.1 pg (ref 27.0–33.0)
MCHC: 33.2 g/dL (ref 32.0–36.0)
MCV: 90.7 fL (ref 80.0–100.0)
MPV: 10.4 fL (ref 7.5–12.5)
Monocytes Absolute: 250 cells/uL (ref 200–950)
Monocytes Relative: 10 %
NEUTROS PCT: 43 %
Neutro Abs: 1075 cells/uL — ABNORMAL LOW (ref 1500–7800)
Platelets: 202 10*3/uL (ref 140–400)
RBC: 4.18 MIL/uL (ref 3.80–5.10)
RDW: 13.5 % (ref 11.0–15.0)
WBC: 2.5 10*3/uL — ABNORMAL LOW (ref 3.8–10.8)

## 2015-06-20 LAB — COMPREHENSIVE METABOLIC PANEL
ALBUMIN: 3.8 g/dL (ref 3.6–5.1)
ALK PHOS: 35 U/L (ref 33–115)
ALT: 15 U/L (ref 6–29)
AST: 19 U/L (ref 10–30)
BUN: 11 mg/dL (ref 7–25)
CALCIUM: 9 mg/dL (ref 8.6–10.2)
CHLORIDE: 103 mmol/L (ref 98–110)
CO2: 27 mmol/L (ref 20–31)
CREATININE: 0.85 mg/dL (ref 0.50–1.10)
Glucose, Bld: 96 mg/dL (ref 65–99)
POTASSIUM: 4.3 mmol/L (ref 3.5–5.3)
SODIUM: 138 mmol/L (ref 135–146)
TOTAL PROTEIN: 6 g/dL — AB (ref 6.1–8.1)
Total Bilirubin: 0.4 mg/dL (ref 0.2–1.2)

## 2015-06-20 LAB — URINALYSIS W MICROSCOPIC + REFLEX CULTURE
BILIRUBIN URINE: NEGATIVE
Bacteria, UA: NONE SEEN [HPF]
CASTS: NONE SEEN [LPF]
Crystals: NONE SEEN [HPF]
GLUCOSE, UA: NEGATIVE
HGB URINE DIPSTICK: NEGATIVE
KETONES UR: NEGATIVE
Leukocytes, UA: NEGATIVE
NITRITE: NEGATIVE
PH: 5.5 (ref 5.0–8.0)
Protein, ur: NEGATIVE
RBC / HPF: NONE SEEN RBC/HPF (ref <=2)
SPECIFIC GRAVITY, URINE: 1.021 (ref 1.001–1.035)
WBC UA: NONE SEEN WBC/HPF (ref <=5)
Yeast: NONE SEEN [HPF]

## 2015-06-20 LAB — LIPID PANEL
CHOL/HDL RATIO: 2.9 ratio (ref <=5.0)
CHOLESTEROL: 195 mg/dL (ref 125–200)
HDL: 67 mg/dL (ref >=46)
LDL Cholesterol: 110 mg/dL (ref <130)
TRIGLYCERIDES: 92 mg/dL (ref <150)
VLDL: 18 mg/dL (ref <30)

## 2015-06-20 LAB — TSH: TSH: 0.97 mIU/L

## 2015-06-20 NOTE — Progress Notes (Signed)
Tina Parks 09-Nov-1970 [id number]   History:    45 y.o.  for annual gyn exam with no complaints today. Patient's past medical history indicated she has a history of left lumpectomy as a result of screening mammogram having noted a suspicious lesion. 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 Recommendations for a left lumpectomy with sentinel lymph node biopsy. She underwent this on 02/21/2012. Her final pathology revealed a 0.3 cm invasive ductal carcinoma that was grade 1. No lymphovascular invasion margins were -2 sentinel nodes were negative for metastatic disease tumor was ER +100% PR +100% HER-2/neu negative with a Ki-67 3%.   patient had genetic testing and counseling performed. She had a gene BX panel performed for breast and ovarian cancer panel. 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 She was also found to have a variant FAM175a, c.1139A>G (p.Asp380Gly). The patient was negative for BRCA1 and BRCA2 gene mutation.  Patient completed radiation therapy and is now on tamoxifen. Patient's husband had a vasectomy. Patient is having normal menstrual cycles.Patient with past history of CIN-1 and 2005 subsequent Pap smears been normal. She had a bone density study done in 2011 secondary to stress fracture of the left hip and family history of osteoporosis her lowest Z. Score was at the right femoral neck with a value -1.2. Past medical history,surgical history, family history and social history were all reviewed and documented in the EPIC chart.  Gynecologic History Patient's last menstrual period was 03/08/2015. Contraception: vasectomy Last Pap: 2016. Results were: normal Last mammogram: June 2016. Results were: normal  Obstetric History OB History  Gravida Para Term Preterm AB SAB TAB Ectopic Multiple Living  1 1 1       1     # Outcome Date GA  Lbr Len/2nd Weight Sex Delivery Anes PTL Lv  1 Term             Obstetric Comments  G1, P1, NuvaRing 1990-2013     ROS: A ROS was performed and pertinent positives and negatives are included in the history.  GENERAL: No fevers or chills. HEENT: No change in vision, no earache, sore throat or sinus congestion. NECK: No pain or stiffness. CARDIOVASCULAR: No chest pain or pressure. No palpitations. PULMONARY: No shortness of breath, cough or wheeze. GASTROINTESTINAL: No abdominal pain, nausea, vomiting or diarrhea, melena or bright red blood per rectum. GENITOURINARY: No urinary frequency, urgency, hesitancy or dysuria. MUSCULOSKELETAL: No joint or muscle pain, no back pain, no recent trauma. DERMATOLOGIC: No rash, no itching, no lesions. ENDOCRINE: No polyuria, polydipsia, no heat or cold intolerance. No recent change in weight. HEMATOLOGICAL: No anemia or easy bruising or bleeding. NEUROLOGIC: No headache, seizures, numbness, tingling or weakness. PSYCHIATRIC: No depression, no loss of interest in normal activity or change in sleep pattern.     Exam: chaperone present  BP 110/70 mmHg  Ht 5' 6.75" (1.695 m)  Wt 167 lb (75.751 kg)  BMI 26.37 kg/m2  LMP 03/08/2015  Body mass index is 26.37 kg/(m^2).  General appearance : Well developed well nourished female. No acute distress HEENT: Eyes: no retinal hemorrhage or exudates,  Neck supple, trachea midline, no carotid bruits, no thyroidmegaly Lungs: Clear to auscultation, no rhonchi or wheezes, or rib retractions  Heart: Regular rate and rhythm, no murmurs or gallops Breast:Examined in sitting and supine position were symmetrical in appearance, no  palpable masses or tenderness,  no skin retraction, no nipple inversion, no nipple discharge, no skin discoloration, no axillary or supraclavicular lymphadenopathy Abdomen: no palpable masses or tenderness, no rebound or guarding Extremities: no edema or skin discoloration or  tenderness  Pelvic:  Bartholin, Urethra, Skene Glands: Within normal limits             Vagina: No gross lesions or discharge  Cervix: No gross lesions or discharge  Uterus  anteverted, normal size, shape and consistency, non-tender and mobile  Adnexa  Without masses or tenderness  Anus and perineum  normal   Rectovaginal  normal sphincter tone without palpated masses or tenderness             Hemoccult not indicated     Assessment/Plan:  45 y.o. 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 Patient is doing well. She was asymptomatic today. She is fasting today the following screening labs will be ordered: Fasting lipid profile, comprehensive metabolic panel, TSH, CBC, and urinalysis. Patient scheduled for mammogram three-dimensional next month. We discussed importance of calcium vitamin D and regular exercise for osteoporosis prevention. Pap smear not indicated this year. Patient will return back to the office next week for pelvic ultrasound for assessment of her ovaries because her history of breast cancer.   Terrance Mass MD, 9:16 AM 06/20/2015

## 2015-06-22 ENCOUNTER — Encounter: Payer: Self-pay | Admitting: Hematology and Oncology

## 2015-06-23 ENCOUNTER — Other Ambulatory Visit: Payer: Self-pay | Admitting: *Deleted

## 2015-06-23 DIAGNOSIS — C50212 Malignant neoplasm of upper-inner quadrant of left female breast: Secondary | ICD-10-CM

## 2015-07-01 ENCOUNTER — Other Ambulatory Visit: Payer: Self-pay | Admitting: Gynecology

## 2015-07-01 ENCOUNTER — Ambulatory Visit (INDEPENDENT_AMBULATORY_CARE_PROVIDER_SITE_OTHER): Payer: 59 | Admitting: Gynecology

## 2015-07-01 ENCOUNTER — Encounter: Payer: Self-pay | Admitting: Gynecology

## 2015-07-01 ENCOUNTER — Ambulatory Visit (INDEPENDENT_AMBULATORY_CARE_PROVIDER_SITE_OTHER): Payer: 59

## 2015-07-01 DIAGNOSIS — R9389 Abnormal findings on diagnostic imaging of other specified body structures: Secondary | ICD-10-CM

## 2015-07-01 DIAGNOSIS — D708 Other neutropenia: Secondary | ICD-10-CM

## 2015-07-01 DIAGNOSIS — N858 Other specified noninflammatory disorders of uterus: Secondary | ICD-10-CM | POA: Diagnosis not present

## 2015-07-01 DIAGNOSIS — Z853 Personal history of malignant neoplasm of breast: Secondary | ICD-10-CM | POA: Diagnosis not present

## 2015-07-01 DIAGNOSIS — D72819 Decreased white blood cell count, unspecified: Secondary | ICD-10-CM

## 2015-07-01 DIAGNOSIS — D709 Neutropenia, unspecified: Secondary | ICD-10-CM | POA: Insufficient documentation

## 2015-07-01 DIAGNOSIS — Z8742 Personal history of other diseases of the female genital tract: Secondary | ICD-10-CM | POA: Diagnosis not present

## 2015-07-01 DIAGNOSIS — R938 Abnormal findings on diagnostic imaging of other specified body structures: Secondary | ICD-10-CM

## 2015-07-01 HISTORY — DX: Neutropenia, unspecified: D70.9

## 2015-07-01 NOTE — Addendum Note (Signed)
Addended by: Thurnell Garbe A on: 07/01/2015 11:51 AM   Modules accepted: Orders

## 2015-07-01 NOTE — Patient Instructions (Signed)
Endometrial Biopsy, Care After Refer to this sheet in the next few weeks. These instructions provide you with information on caring for yourself after your procedure. Your health care provider may also give you more specific instructions. Your treatment has been planned according to current medical practices, but problems sometimes occur. Call your health care provider if you have any problems or questions after your procedure. WHAT TO EXPECT AFTER THE PROCEDURE After your procedure, it is typical to have the following:  You may have mild cramping and a small amount of vaginal bleeding for a few days after the procedure. This is normal. HOME CARE INSTRUCTIONS  Only take over-the-counter or prescription medicine as directed by your health care provider.  Do not douche, use tampons, or have sexual intercourse until your health care provider approves.  Follow your health care provider's instructions regarding any activity restrictions, such as strenuous exercise or heavy lifting. SEEK MEDICAL CARE IF:  You have heavy bleeding or bleeding longer than 2 days after the procedure.  You have bad smelling drainage from your vagina.  You have a fever and chills.  Youhave severe lower stomach (abdominal) pain. SEEK IMMEDIATE MEDICAL CARE IF:  You have severe cramps in your stomach or back.  You pass large blood clots.  Your bleeding increases.  You become weak or lightheaded, or you pass out.   This information is not intended to replace advice given to you by your health care provider. Make sure you discuss any questions you have with your health care provider.   Document Released: 11/05/2012 Document Reviewed: 11/05/2012 Elsevier Interactive Patient Education [employer].

## 2015-07-01 NOTE — Progress Notes (Signed)
HPI: Tina Parks is a 45 year old who was seen in the office for her annual exam on May 22. Because of patient's history of breast cancer we are screening with pelvic ultrasound to look at her ovaries on an annual basis. Her history is as follows:  Patient with past history of left lumpectomy as a result of screening mammogram having noted a suspicious lesion. 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 Recommendations for a left lumpectomy with sentinel lymph node biopsy. She underwent this on 02/21/2012. Her final pathology revealed a 0.3 cm invasive ductal carcinoma that was grade 1. No lymphovascular invasion margins were -2 sentinel nodes were negative for metastatic disease tumor was ER +100% PR +100% HER-2/neu negative with a Ki-67 3%.   patient had genetic testing and counseling performed. She had a gene BX panel performed for breast and ovarian cancer panel. 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 She was also found to have a variant FAM175a, c.1139A>G (p.Asp380Gly). The patient was negative for BRCA1 and BRCA2 gene mutation.  Patient completed radiation therapy and is now on tamoxifen. Patient's husband had a vasectomy.  Patient's last office visit labs were all normal with the exception that her white blood count was found to be low at 2.5 and her neutrophil count was low at 1075. She is asymptomatic. Reports no recent infection  She has not had a menstrual cycle since early this year. She is otherwise asymptomatic.  Ultrasound today: Uterus measured 8.4 x 4.8 x 4.2 cm with endometrial stripe of 9 mm. Prominent endometrium avascular. Right and left ovary were normal. Previous cyst not seen. Endometrial stripe last year was 5 mm.    ROS: A ROS was performed and pertinent positives and negatives are included in the history.  GENERAL: No fevers or chills.  HEENT: No change in vision, no earache, sore throat or sinus congestion. NECK: No pain or stiffness. CARDIOVASCULAR: No chest pain or pressure. No palpitations. PULMONARY: No shortness of breath, cough or wheeze. GASTROINTESTINAL: No abdominal pain, nausea, vomiting or diarrhea, melena or bright red blood per rectum. GENITOURINARY: No urinary frequency, urgency, hesitancy or dysuria. MUSCULOSKELETAL: No joint or muscle pain, no back pain, no recent trauma. DERMATOLOGIC: No rash, no itching, no lesions. ENDOCRINE: No polyuria, polydipsia, no heat or cold intolerance. No recent change in weight. HEMATOLOGICAL: No anemia or easy bruising or bleeding. NEUROLOGIC: No headache, seizures, numbness, tingling or weakness. PSYCHIATRIC: No depression, no loss of interest in normal activity or change in sleep pattern.   Exam done less than 10 days ago see previous note Due to patient's endometrial thickness and been on tamoxifen she was counseled for an endometrial biopsy. The cervix was cleansed with Betadine solution. A sterile Pipelle was introduced into the uterine cavity. The uterus sounded to 7 cm. Several passes with the Pipelle was required in an effort to obtain sufficient tissue to submit for histological evaluation. Patient tolerated procedure well.  Assessment Plan: Previous left ovarian cyst completely resolved. Due to endometrial thickness in patient been on tamoxifen and endometrial biopsy was done today result pending at time of this dictation. I have contacted the patient several days ago and she has made an appointment to follow-up with her medical oncologist next week as a result of her leukopenia and neutropenia.    Greater than 50% of time was spent in counseling and coordinating care  of this patient.   Time of consultation: 15   Minutes.

## 2015-07-06 ENCOUNTER — Ambulatory Visit (HOSPITAL_BASED_OUTPATIENT_CLINIC_OR_DEPARTMENT_OTHER): Payer: 59 | Admitting: Hematology and Oncology

## 2015-07-06 ENCOUNTER — Encounter: Payer: Self-pay | Admitting: Hematology and Oncology

## 2015-07-06 ENCOUNTER — Telehealth: Payer: Self-pay | Admitting: Hematology and Oncology

## 2015-07-06 ENCOUNTER — Other Ambulatory Visit (HOSPITAL_BASED_OUTPATIENT_CLINIC_OR_DEPARTMENT_OTHER): Payer: 59

## 2015-07-06 VITALS — BP 129/93 | HR 87 | Temp 98.5°F | Resp 18 | Ht 66.75 in | Wt 165.3 lb

## 2015-07-06 DIAGNOSIS — C50212 Malignant neoplasm of upper-inner quadrant of left female breast: Secondary | ICD-10-CM

## 2015-07-06 DIAGNOSIS — D72819 Decreased white blood cell count, unspecified: Secondary | ICD-10-CM | POA: Diagnosis not present

## 2015-07-06 DIAGNOSIS — Z170 Estrogen receptor positive status [ER+]: Secondary | ICD-10-CM | POA: Diagnosis not present

## 2015-07-06 DIAGNOSIS — Z923 Personal history of irradiation: Secondary | ICD-10-CM

## 2015-07-06 DIAGNOSIS — Z79810 Long term (current) use of selective estrogen receptor modulators (SERMs): Secondary | ICD-10-CM

## 2015-07-06 LAB — CBC WITH DIFFERENTIAL/PLATELET
BASO%: 0.4 % (ref 0.0–2.0)
Basophils Absolute: 0 10*3/uL (ref 0.0–0.1)
EOS ABS: 0.1 10*3/uL (ref 0.0–0.5)
EOS%: 2 % (ref 0.0–7.0)
HCT: 40.8 % (ref 34.8–46.6)
HEMOGLOBIN: 13.5 g/dL (ref 11.6–15.9)
LYMPH%: 33.4 % (ref 14.0–49.7)
MCH: 29.6 pg (ref 25.1–34.0)
MCHC: 33 g/dL (ref 31.5–36.0)
MCV: 89.6 fL (ref 79.5–101.0)
MONO#: 0.3 10*3/uL (ref 0.1–0.9)
MONO%: 8.1 % (ref 0.0–14.0)
NEUT%: 56.1 % (ref 38.4–76.8)
NEUTROS ABS: 1.9 10*3/uL (ref 1.5–6.5)
Platelets: 188 10*3/uL (ref 145–400)
RBC: 4.56 10*6/uL (ref 3.70–5.45)
RDW: 12.7 % (ref 11.2–14.5)
WBC: 3.3 10*3/uL — AB (ref 3.9–10.3)
lymph#: 1.1 10*3/uL (ref 0.9–3.3)

## 2015-07-06 NOTE — Assessment & Plan Note (Signed)
WBC 3.3 with ANC 2.1; Remainder of cels were normal.  DD:  1. Autoimmune causes: ANA Neg (patient has Hashimoto's thyroiditis)  2. Check J-12 and folic acid: Pending from today  3. Medications could be a cause: Tamoxifen has rarely been found to be associated with leukopenia. I reviewed her remaining medications and they're not likely to cause leukopenia.  Patient does not have any viral illnesses and any other sickness.

## 2015-07-06 NOTE — Assessment & Plan Note (Signed)
left breast lumpectomy 02/01/2012: 0.3 cm invasive ductal carcinoma grade 1, 0/2 sentinel nodes, ER 100%, PR 100%, HER-2 negative, Ki-67 3% Followed by adjuvant radiation completed 04/28/2012  Currently on tamoxifen tolerating it very well, started 05/14/2012 Breast Cancer Surveillance: 1. Breast exam 07/06/2015: Normal 2. Mammogram to be done 07/07/2015  Return to clinic in 1 year for follow-up  

## 2015-07-06 NOTE — Telephone Encounter (Signed)
appt made and avs printed °

## 2015-07-06 NOTE — Progress Notes (Signed)
Patient Care Team: Mancel Bale, PA-C as PCP - General Neldon Mc, MD as Surgeon (General Surgery) Thea Silversmith, MD (Radiation Oncology) Eston Esters, MD (Hematology and Oncology)  DIAGNOSIS: Primary cancer of upper inner quadrant of left female breast [facility])   Staging form: Breast, AJCC 7th Edition     Clinical stage from 12/18/2011: Stage IA (T75mc, N0, cM0) - Signed by [name] on 12/18/2011     Pathologic: Stage IA (T1a, N0, cM0) - Signed by [name] on 03/11/2012   SUMMARY OF ONCOLOGIC HISTORY:   [place] of upper inner quadrant of [place]   12/19/2011 -  Anti-estrogen oral therapy tamoxifen 20 mg daily   02/21/2012 Surgery left breast lumpectomy: 0.3 cm invasive ductal carcinoma grade 1, 0/2 sentinel nodes, ER 100%, PR 100%, HER-2 negative, Ki-67 3%    Procedure gene DX panel performed for breast and ovarian cancer panel. 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 She was also found to have a variant FAM175a, c.1139A>G (p.Asp380Gly   04/01/2012 - 04/28/2012 Radiation Therapy adjuvant radiation therapy with Dr. WPablo Ledger   CHIEF COMPLIANT: Follow-up after recent blood work that showed profound leukopenia  INTERVAL HISTORY: Tina MARCYis a 45year old with above-mentioned history left breast cancer who had undergone lumpectomy followed by adjuvant radiation and is currently on tamoxifen therapy. She appears to tolerate tamoxifen moderately well with occasional hot flashes for which she takes Effexor. 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 Denies any myalgias. Effexor is helping her significantly. Recent blood work revealed decrease in the white blood cell count and ANC and she was asked to make an appointment to see [place] It appears that these numbers have returned to relatively normal levels. She does not have any  problems with recent infections. There has not been any new medications recently.  REVIEW OF SYSTEMS:   Constitutional: Denies fevers, chills or abnormal weight loss Eyes: Denies blurriness of vision Ears, nose, mouth, throat, and face: Denies mucositis or sore throat Respiratory: Denies cough, dyspnea or wheezes Cardiovascular: Denies palpitation, chest discomfort Gastrointestinal:  Denies nausea, heartburn or change in bowel habits Skin: Denies abnormal skin rashes Lymphatics: Denies new lymphadenopathy or easy bruising Neurological:Denies numbness, tingling or new weaknesses Behavioral/Psych: Mood is stable, no new changes  Extremities: No lower extremity edema Breast:  denies any pain or lumps or nodules in either breasts All other systems were reviewed with the patient and are negative.  I have reviewed the past medical history, past surgical history, social history and family history with the patient and they are unchanged from previous note.  ALLERGIES:  has No Known Allergies.  MEDICATIONS:  Current Outpatient Prescriptions  Medication Sig Dispense Refill  . ALPRAZolam (XANAX) 1 MG tablet Take 1 mg by mouth at bedtime as needed. sleep    . fluconazole (DIFLUCAN) 150 MG tablet Take 1 tablet (150 mg total) by mouth once. Repeat in 1 week is needed (Patient not taking: Reported on 06/20/2015) 2 tablet 0  . montelukast (SINGULAIR) 10 MG tablet TAKE 1 TABLET BY MOUTH DAILY. 90 tablet 3  . SYNTHROID 75 MCG tablet Take 1 tablet (75 mcg total) by mouth daily before breakfast. 30 tablet 0  . tamoxifen (NOLVADEX) 20 MG tablet Take 1 tablet (20 mg total) by mouth daily. 90 tablet 3  . valACYclovir (VALTREX) 1000 MG  tablet Take 2 tablets (2,000 mg total) by mouth as needed. For fever blister 30 tablet 12  . venlafaxine XR (EFFEXOR-XR) 37.5 MG 24 hr capsule TAKE 1 CAPSULE BY MOUTH DAILY. (Patient not taking: Reported on 06/20/2015) 90 capsule 3  . Vitamin D, Ergocalciferol, (DRISDOL) 50000  UNITS CAPS capsule Take 1 capsule (50,000 Units total) by mouth every 7 (seven) days. (Patient not taking: Reported on 01/13/2015) 12 capsule 0   No current facility-administered medications for this visit.    PHYSICAL EXAMINATION: ECOG PERFORMANCE STATUS: 0 - Asymptomatic  Filed Vitals:   07/06/15 1131  BP: 129/93  Pulse: 87  Temp: 98.5 F (36.9 C)  Resp: 18   Filed Weights   07/06/15 1131  Weight: 165 lb 4.8 oz (74.98 kg)    GENERAL:alert, no distress and comfortable SKIN: skin color, texture, turgor are normal, no rashes or significant lesions EYES: normal, Conjunctiva are pink and non-injected, sclera clear OROPHARYNX:no exudate, no erythema and lips, buccal mucosa, and tongue normal  NECK: supple, thyroid normal size, non-tender, without nodularity LYMPH:  no palpable lymphadenopathy in the cervical, axillary or inguinal LUNGS: clear to auscultation and percussion with normal breathing effort HEART: regular rate & rhythm and no murmurs and no lower extremity edema ABDOMEN:abdomen soft, non-tender and normal bowel sounds MUSCULOSKELETAL:no cyanosis of digits and no clubbing  NEURO: alert & oriented x 3 with fluent speech, no focal motor/sensory deficits EXTREMITIES: No lower extremity edema BREAST: No palpable masses or nodules in either right or left breasts. No palpable axillary supraclavicular or infraclavicular adenopathy no breast tenderness or nipple discharge. (exam performed in the presence of a chaperone)  LABORATORY DATA:  I have reviewed the data as listed   Chemistry      Component Value Date/Time   NA 138 06/20/2015 0902   NA 138 11/24/2014 1338   K 4.3 06/20/2015 0902   K 4.0 11/24/2014 1338   CL 103 06/20/2015 0902   CL 102 05/19/2012 1431   CO2 27 06/20/2015 0902   CO2 26 11/24/2014 1338   BUN 11 06/20/2015 0902   BUN 12.0 11/24/2014 1338   CREATININE 0.85 06/20/2015 0902   CREATININE 0.9 11/24/2014 1338   CREATININE 0.71 02/21/2012 1250        Component Value Date/Time   CALCIUM 9.0 06/20/2015 0902   CALCIUM 8.8 11/24/2014 1338   ALKPHOS 35 06/20/2015 0902   ALKPHOS 39* 11/24/2014 1338   AST 19 06/20/2015 0902   AST 11 11/24/2014 1338   ALT 15 06/20/2015 0902   ALT 9 11/24/2014 1338   BILITOT 0.4 06/20/2015 0902   BILITOT <0.30 11/24/2014 1338       Lab Results  Component Value Date   WBC 3.3* 07/06/2015   HGB 13.5 07/06/2015   HCT 40.8 07/06/2015   MCV 89.6 07/06/2015   PLT 188 07/06/2015   NEUTROABS 1.9 07/06/2015     ASSESSMENT & PLAN:  Primary cancer of upper inner quadrant of left female breast left breast lumpectomy 02/01/2012: 0.3 cm invasive ductal carcinoma grade 1, 0/2 sentinel nodes, ER 100%, PR 100%, HER-2 negative, Ki-67 3% Followed by adjuvant radiation completed 04/28/2012  Currently on tamoxifen tolerating it very well, started 05/14/2012 Breast Cancer Surveillance: 1. Breast exam 07/06/2015: Normal 2. Mammogram to be done 07/07/2015  Return to clinic in December for follow-up and after that we can see her annually.   Leucopenia WBC 3.3 with ANC 1.9; Remainder of cells were normal.  2 weeks ago she was noted to  have an [place] of 1095. This raises red flag and made her come see [place]. It appears that the counts had returned to reasonable normal levels.  Previous workup:  1. Autoimmune causes: ANA Neg (patient has Hashimoto's thyroiditis)  2. Check H-60 and folic acid: Pending from today  3. Medications could be a cause: Tamoxifen has rarely been found to be associated with leukopenia. I reviewed her remaining medications and they're not likely to cause leukopenia.  Patient does not have any viral illnesses and any other sickness.   I am not concerned about the mild leukopenia.  No orders of the defined types were placed in this encounter.   The patient has a good understanding of the overall plan. she agrees with it. she will call with any problems that may develop before the next visit  here.   Rulon Eisenmenger, MD 07/06/2015

## 2015-07-07 ENCOUNTER — Ambulatory Visit
Admission: RE | Admit: 2015-07-07 | Discharge: 2015-07-07 | Disposition: A | Discharge status: Home / Self Care | Payer: 59 | Source: Ambulatory Visit | Attending: Hematology and Oncology | Admitting: Hematology and Oncology

## 2015-07-07 ENCOUNTER — Other Ambulatory Visit: Payer: Self-pay | Admitting: Hematology and Oncology

## 2015-07-07 DIAGNOSIS — Z853 Personal history of malignant neoplasm of breast: Secondary | ICD-10-CM

## 2015-07-07 DIAGNOSIS — N6310 Unspecified lump in the right breast, unspecified quadrant: Secondary | ICD-10-CM

## 2015-07-07 DIAGNOSIS — N63 Unspecified lump in breast: Secondary | ICD-10-CM | POA: Diagnosis not present

## 2015-07-07 LAB — FOLATE: Folate: 12.3 ng/mL (ref >3.0)

## 2015-07-07 LAB — VITAMIN B12: Vitamin B12: 319 pg/mL (ref 211–946)

## 2015-07-07 LAB — ANTINUCLEAR ANTIBODIES, IFA: ANTINUCLEAR ANTIBODIES, IFA: NEGATIVE

## 2015-07-08 ENCOUNTER — Other Ambulatory Visit: Payer: Self-pay | Admitting: Hematology and Oncology

## 2015-07-08 DIAGNOSIS — N6310 Unspecified lump in the right breast, unspecified quadrant: Secondary | ICD-10-CM

## 2015-07-13 ENCOUNTER — Ambulatory Visit
Admission: RE | Admit: 2015-07-13 | Discharge: 2015-07-13 | Disposition: A | Discharge status: Home / Self Care | Payer: 59 | Source: Ambulatory Visit | Attending: Hematology and Oncology | Admitting: Hematology and Oncology

## 2015-07-13 ENCOUNTER — Other Ambulatory Visit: Payer: Self-pay | Admitting: Physician Assistant

## 2015-07-13 DIAGNOSIS — N6310 Unspecified lump in the right breast, unspecified quadrant: Secondary | ICD-10-CM

## 2015-07-13 DIAGNOSIS — N63 Unspecified lump in breast: Secondary | ICD-10-CM | POA: Diagnosis not present

## 2015-07-13 DIAGNOSIS — N6011 Diffuse cystic mastopathy of right breast: Secondary | ICD-10-CM | POA: Diagnosis not present

## 2015-07-13 HISTORY — PX: BREAST BIOPSY: SHX20

## 2015-07-13 MED FILL — MONTELUKAST SOD 10 MG TAB: 10 | 90 days supply | Qty: 90 | Fill #3

## 2015-07-13 MED FILL — TAMOXIFEN 20 MG TABLET: 20 | 90 days supply | Qty: 90 | Fill #1

## 2015-07-13 MED FILL — SYNTHROID 75 MCG TABLET: 75 | 90 days supply | Qty: 90 | Fill #0

## 2015-07-13 MED FILL — VENLAFAXINE HCL ER 37.5 MG: 37.5 | 90 days supply | Qty: 90 | Fill #2

## 2015-08-01 DIAGNOSIS — C50212 Malignant neoplasm of upper-inner quadrant of left female breast: Secondary | ICD-10-CM | POA: Diagnosis not present

## 2015-10-11 ENCOUNTER — Other Ambulatory Visit: Payer: Self-pay | Admitting: Physician Assistant

## 2015-10-11 MED FILL — SYNTHROID 75 MCG TABLET: 75 | 90 days supply | Qty: 90 | Fill #1

## 2015-10-11 MED FILL — TAMOXIFEN 20 MG TABLET: 20 | 90 days supply | Qty: 90 | Fill #2

## 2015-10-11 MED FILL — VENLAFAXINE HCL ER 37.5 MG: 37.5 | 90 days supply | Qty: 90 | Fill #3

## 2015-10-12 ENCOUNTER — Other Ambulatory Visit: Payer: Self-pay | Admitting: Physician Assistant

## 2015-10-12 MED ORDER — SYNTHROID 75 MCG PO TABS: ORAL_TABLET | ORAL | 1 refills | Status: DC

## 2015-10-27 MED FILL — MONTELUKAST SOD 10 MG TAB: 10 | 90 days supply | Qty: 90 | Fill #0

## 2015-11-24 ENCOUNTER — Ambulatory Visit: Payer: 59 | Admitting: Hematology and Oncology

## 2015-11-24 ENCOUNTER — Other Ambulatory Visit: Payer: 59

## 2015-12-09 ENCOUNTER — Telehealth: Payer: Self-pay | Admitting: Physician Assistant

## 2015-12-09 DIAGNOSIS — B001 Herpesviral vesicular dermatitis: Secondary | ICD-10-CM

## 2015-12-09 MED ORDER — VALACYCLOVIR HCL 1 G PO TABS: 2000.0000 mg | ORAL_TABLET | ORAL | 1 refills | Status: AC | PRN

## 2015-12-09 MED FILL — valACYclovir HCL 1 GM TABS: 1 | 15 days supply | Qty: 30 | Fill #0

## 2015-12-09 NOTE — Telephone Encounter (Signed)
Patient calling in complaining of a fever blister.  Has been prescribed valtrex in the past for same and this has been helpful. She is requesting a refill. This is a low risk medication and I am happy to provide a refill.  Script sent to [facility].  Philis Fendt, MS, PA-C 8:08 AM, 12/09/2015

## 2016-01-03 ENCOUNTER — Other Ambulatory Visit: Payer: Self-pay

## 2016-01-03 DIAGNOSIS — C50212 Malignant neoplasm of upper-inner quadrant of left female breast: Secondary | ICD-10-CM

## 2016-01-03 NOTE — Assessment & Plan Note (Signed)
left breast lumpectomy 02/01/2012: 0.3 cm invasive ductal carcinoma grade 1, 0/2 sentinel nodes, ER 100%, PR 100%, HER-2 negative, Ki-67 3% Followed by adjuvant radiation completed 04/28/2012  Currently on tamoxifen tolerating it very well, started 05/14/2012 Breast Cancer Surveillance: 1. Breast exam 07/06/2015: Normal 2. Mammogram to be done 07/07/2015  Return to clinic in December for follow-up and after that we can see her annually.   Leucopenia WBC 3.3 with ANC 1.9; Remainder of cells were normal.  2 weeks ago she was noted to have an [place] of 1095. This raises red flag and made her come see [place]. It appears that the counts had returned to reasonable normal levels.  Previous workup:  1. Autoimmune causes: ANA Neg (patient has Hashimoto's thyroiditis)  2. Check D-42 and folic acid: Pending from today  3. Medications could be a cause: Tamoxifen has rarely been found to be associated with leukopenia. I reviewed her remaining medications and they're not likely to cause leukopenia.  Patient does not have any viral illnesses and any other sickness.   RTC in 1 year

## 2016-01-04 ENCOUNTER — Other Ambulatory Visit (HOSPITAL_BASED_OUTPATIENT_CLINIC_OR_DEPARTMENT_OTHER): Payer: 59

## 2016-01-04 ENCOUNTER — Ambulatory Visit (HOSPITAL_BASED_OUTPATIENT_CLINIC_OR_DEPARTMENT_OTHER): Payer: 59 | Admitting: Hematology and Oncology

## 2016-01-04 ENCOUNTER — Encounter: Payer: Self-pay | Admitting: Hematology and Oncology

## 2016-01-04 DIAGNOSIS — C50212 Malignant neoplasm of upper-inner quadrant of left female breast: Secondary | ICD-10-CM

## 2016-01-04 DIAGNOSIS — D72819 Decreased white blood cell count, unspecified: Secondary | ICD-10-CM

## 2016-01-04 DIAGNOSIS — Z79810 Long term (current) use of selective estrogen receptor modulators (SERMs): Secondary | ICD-10-CM

## 2016-01-04 LAB — CBC WITH DIFFERENTIAL/PLATELET
BASO%: 0.5 % (ref 0.0–2.0)
BASOS ABS: 0 10*3/uL (ref 0.0–0.1)
EOS%: 3.5 % (ref 0.0–7.0)
Eosinophils Absolute: 0.1 10*3/uL (ref 0.0–0.5)
HEMATOCRIT: 38.2 % (ref 34.8–46.6)
HEMOGLOBIN: 12.7 g/dL (ref 11.6–15.9)
LYMPH#: 1 10*3/uL (ref 0.9–3.3)
LYMPH%: 34.7 % (ref 14.0–49.7)
MCH: 29.8 pg (ref 25.1–34.0)
MCHC: 33.3 g/dL (ref 31.5–36.0)
MCV: 89.4 fL (ref 79.5–101.0)
MONO#: 0.3 10*3/uL (ref 0.1–0.9)
MONO%: 9.2 % (ref 0.0–14.0)
NEUT#: 1.5 10*3/uL (ref 1.5–6.5)
NEUT%: 52.1 % (ref 38.4–76.8)
PLATELETS: 193 10*3/uL (ref 145–400)
RBC: 4.28 10*6/uL (ref 3.70–5.45)
RDW: 12.8 % (ref 11.2–14.5)
WBC: 2.9 10*3/uL — ABNORMAL LOW (ref 3.9–10.3)

## 2016-01-04 LAB — COMPREHENSIVE METABOLIC PANEL
ALBUMIN: 3.3 g/dL — AB (ref 3.5–5.0)
ALK PHOS: 37 U/L — AB (ref 40–150)
ALT: 19 U/L (ref 0–55)
AST: 19 U/L (ref 5–34)
Anion Gap: 7 mEq/L (ref 3–11)
BILIRUBIN TOTAL: 0.32 mg/dL (ref 0.20–1.20)
BUN: 12.1 mg/dL (ref 7.0–26.0)
CALCIUM: 9.1 mg/dL (ref 8.4–10.4)
CO2: 27 mEq/L (ref 22–29)
CREATININE: 0.8 mg/dL (ref 0.6–1.1)
Chloride: 107 mEq/L (ref 98–109)
EGFR: 85 mL/min/{1.73_m2} — ABNORMAL LOW (ref >90)
Glucose: 105 mg/dl (ref 70–140)
Potassium: 4.8 mEq/L (ref 3.5–5.1)
Sodium: 141 mEq/L (ref 136–145)
Total Protein: 6.1 g/dL — ABNORMAL LOW (ref 6.4–8.3)

## 2016-01-04 MED ORDER — TAMOXIFEN CITRATE 20 MG PO TABS
20.0000 mg | ORAL_TABLET | Freq: Every day | ORAL | 3 refills | Status: DC
Start: 2016-01-04 — End: 2017-06-27

## 2016-01-04 MED ORDER — VITAMIN D 1000 UNITS PO TABS: 1000.0000 [IU] | ORAL_TABLET | Freq: Every day | ORAL | Status: AC

## 2016-01-04 MED FILL — TAMOXIFEN 20 MG TABLET: 20 | 90 days supply | Qty: 90 | Fill #0

## 2016-01-04 NOTE — Progress Notes (Signed)
Patient Care Team: Mancel Bale, PA-C as PCP - General Neldon Mc, MD as Surgeon (General Surgery) Thea Silversmith, MD (Radiation Oncology) Eston Esters, MD (Hematology and Oncology)  DIAGNOSIS:  Encounter Diagnosis  Name Primary?  . Primary cancer of upper inner quadrant of left female breast ([facility])     SUMMARY OF ONCOLOGIC HISTORY:   Primary cancer of upper inner quadrant of left female breast ([place])   12/19/2011 -  Anti-estrogen oral therapy    tamoxifen 20 mg daily      02/21/2012 Surgery    left breast lumpectomy: 0.3 cm invasive ductal carcinoma grade 1, 0/2 sentinel nodes, ER 100%, PR 100%, HER-2 negative, Ki-67 3%       Procedure    gene DX panel performed for breast and ovarian cancer panel. 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 Pablo Ledger      05/14/2012 -  Anti-estrogen oral therapy    Tamoxifen 20 mg daily       CHIEF COMPLIANT: Follow-up on tamoxifen therapy  INTERVAL HISTORY: Tina Parks is a 45 year old with above-mentioned history of left breast cancer treated with lumpectomy and adjuvant radiation and is currently on tamoxifen. She is tolerating tamoxifen fairly well without any major problems with hot flashes. She was found to be leukopenic and is currently being observed.  REVIEW OF SYSTEMS:   Constitutional: Denies fevers, chills or abnormal weight loss Eyes: Denies blurriness of vision Ears, nose, mouth, throat, and face: Denies mucositis or sore throat Respiratory: Denies cough, dyspnea or wheezes Cardiovascular: Denies palpitation, chest discomfort Gastrointestinal:  Denies nausea, heartburn or change in bowel habits Skin: Denies abnormal skin rashes Lymphatics: Denies new lymphadenopathy or easy  bruising Neurological:Denies numbness, tingling or new weaknesses Behavioral/Psych: Mood is stable, no new changes  Extremities: No lower extremity edema Breast:  denies any pain or lumps or nodules in either breasts All other systems were reviewed with the patient and are negative.  I have reviewed the past medical history, past surgical history, social history and family history with the patient and they are unchanged from previous note.  ALLERGIES:  has No Known Allergies.  MEDICATIONS:  Current Outpatient Prescriptions  Medication Sig Dispense Refill  . ALPRAZolam (XANAX) 1 MG tablet Take 1 mg by mouth at bedtime as needed. sleep    . fluconazole (DIFLUCAN) 150 MG tablet Take 1 tablet (150 mg total) by mouth once. Repeat in 1 week is needed (Patient not taking: Reported on 06/20/2015) 2 tablet 0  . montelukast (SINGULAIR) 10 MG tablet TAKE 1 TABLET BY MOUTH DAILY. 90 tablet 0  . SYNTHROID 75 MCG tablet TAKE 1 TABLET BY MOUTH DAILY BEFORE BREAKFAST. 90 tablet 1  . tamoxifen (NOLVADEX) 20 MG tablet Take 1 tablet (20 mg total) by mouth daily. 90 tablet 3  . valACYclovir (VALTREX) 1000 MG tablet Take 2 tablets (2,000 mg total) by mouth as needed. For fever blister 30 tablet 1  . venlafaxine XR (EFFEXOR-XR) 37.5 MG 24 hr capsule TAKE 1 CAPSULE BY MOUTH DAILY. (Patient not taking: Reported on 06/20/2015) 90 capsule 3  . Vitamin D, Ergocalciferol, (DRISDOL) 50000 UNITS CAPS capsule Take 1 capsule (50,000 Units total) by mouth every 7 (seven) days. (Patient not taking: Reported on 01/13/2015) 12 capsule 0  No current facility-administered medications for this visit.     PHYSICAL EXAMINATION: ECOG PERFORMANCE STATUS: 0 - Asymptomatic  There were no vitals filed for this visit. There were no vitals filed for this visit.  GENERAL:alert, no distress and comfortable SKIN: skin color, texture, turgor are normal, no rashes or significant lesions EYES: normal, Conjunctiva are pink and  non-injected, sclera clear OROPHARYNX:no exudate, no erythema and lips, buccal mucosa, and tongue normal  NECK: supple, thyroid normal size, non-tender, without nodularity LYMPH:  no palpable lymphadenopathy in the cervical, axillary or inguinal LUNGS: clear to auscultation and percussion with normal breathing effort HEART: regular rate & rhythm and no murmurs and no lower extremity edema ABDOMEN:abdomen soft, non-tender and normal bowel sounds MUSCULOSKELETAL:no cyanosis of digits and no clubbing  NEURO: alert & oriented x 3 with fluent speech, no focal motor/sensory deficits EXTREMITIES: No lower extremity edema BREAST: No palpable masses or nodules in either right or left breasts. No palpable axillary supraclavicular or infraclavicular adenopathy no breast tenderness or nipple discharge. (exam performed in the presence of a chaperone)  LABORATORY DATA:  I have reviewed the data as listed   Chemistry      Component Value Date/Time   NA 138 06/20/2015 0902   NA 138 11/24/2014 1338   K 4.3 06/20/2015 0902   K 4.0 11/24/2014 1338   CL 103 06/20/2015 0902   CL 102 05/19/2012 1431   CO2 27 06/20/2015 0902   CO2 26 11/24/2014 1338   BUN 11 06/20/2015 0902   BUN 12.0 11/24/2014 1338   CREATININE 0.85 06/20/2015 0902   CREATININE 0.9 11/24/2014 1338      Component Value Date/Time   CALCIUM 9.0 06/20/2015 0902   CALCIUM 8.8 11/24/2014 1338   ALKPHOS 35 06/20/2015 0902   ALKPHOS 39 (L) 11/24/2014 1338   AST 19 06/20/2015 0902   AST 11 11/24/2014 1338   ALT 15 06/20/2015 0902   ALT 9 11/24/2014 1338   BILITOT 0.4 06/20/2015 0902   BILITOT <0.30 11/24/2014 1338       Lab Results  Component Value Date   WBC 2.9 (L) 01/04/2016   HGB 12.7 01/04/2016   HCT 38.2 01/04/2016   MCV 89.4 01/04/2016   PLT 193 01/04/2016   NEUTROABS 1.5 01/04/2016    ASSESSMENT & PLAN:  Primary cancer of upper inner quadrant of left female breast left breast lumpectomy 02/01/2012: 0.3 cm invasive  ductal carcinoma grade 1, 0/2 sentinel nodes, ER 100%, PR 100%, HER-2 negative, Ki-67 3% Followed by adjuvant radiation completed 04/28/2012 Patient started tamoxifen neoadjuvant lead in December 2013  Currently on tamoxifen tolerating it very well, resumed 05/14/2012  Tamoxifen counseling: 1. Hot flashes have resolved Patient is perimenopausal We will send for breast cancer index to determine if she would benefit from extended adjuvant therapy.  Breast Cancer Surveillance: 1. Breast exam 07/06/2015: Normal 2. Mammogram to be done 07/07/2015  Return to clinic in December for follow-up and after that we can see her annually.  Leucopenia WBC 2.9 with ANC 1.5; Remainder of cells were normal.   Previous workup:  1. Autoimmune causes: ANA Neg (patient has Hashimoto's thyroiditis)  2. Check B-55 and folic acid: Pending from today  3. Medications could be a cause: Tamoxifen has rarely been found to be associated with leukopenia. I reviewed her remaining medications and they're not likely to cause leukopenia.  Patient does not have any viral illnesses and any other sickness.   RTC in 1 year   No orders  of the defined types were placed in this encounter.  The patient has a good understanding of the overall plan. she agrees with it. she will call with any problems that may develop before the next visit here.   Rulon Eisenmenger, MD 01/04/16

## 2016-01-05 ENCOUNTER — Telehealth: Payer: Self-pay | Admitting: *Deleted

## 2016-01-05 LAB — FOLATE: Folate: 12.8 ng/mL (ref >3.0)

## 2016-01-05 LAB — VITAMIN B12: Vitamin B12: 333 pg/mL (ref 232–1245)

## 2016-01-05 NOTE — Telephone Encounter (Signed)
Received order for BCI testing. Requisition sent to pathology

## 2016-01-10 ENCOUNTER — Other Ambulatory Visit: Payer: Self-pay | Admitting: Physician Assistant

## 2016-01-10 ENCOUNTER — Other Ambulatory Visit (INDEPENDENT_AMBULATORY_CARE_PROVIDER_SITE_OTHER): Payer: 59

## 2016-01-10 DIAGNOSIS — E039 Hypothyroidism, unspecified: Secondary | ICD-10-CM

## 2016-01-10 MED ORDER — VENLAFAXINE HCL ER 37.5 MG PO CP24: 37.5000 mg | ORAL_CAPSULE | Freq: Every day | ORAL | 3 refills | Status: DC

## 2016-01-10 MED ORDER — MONTELUKAST SODIUM 10 MG PO TABS: 10.0000 mg | ORAL_TABLET | Freq: Every day | ORAL | 3 refills | Status: DC

## 2016-01-10 MED FILL — VENLAFAXINE HCL ER 37.5 MG: 37.5 | 90 days supply | Qty: 90 | Fill #0

## 2016-01-10 NOTE — Progress Notes (Signed)
Pt needs medications refills.  It is time for her TSH check.  She will have that done in the next several days and then we will do her synthroid Rx at that time.

## 2016-01-11 LAB — TSH: TSH: 0.934 u[IU]/mL (ref 0.450–4.500)

## 2016-01-11 MED ORDER — SYNTHROID 75 MCG PO TABS
ORAL_TABLET | ORAL | 1 refills | Status: DC
Start: 2016-01-11 — End: 2016-07-09

## 2016-01-11 MED FILL — SYNTHROID 75 MCG TABLET: 75 | 90 days supply | Qty: 90 | Fill #0

## 2016-01-11 MED FILL — MONTELUKAST SOD 10 MG TAB: 10 | 90 days supply | Qty: 90 | Fill #0

## 2016-01-27 ENCOUNTER — Telehealth: Payer: Self-pay | Admitting: *Deleted

## 2016-01-27 ENCOUNTER — Encounter (HOSPITAL_COMMUNITY): Payer: Self-pay

## 2016-01-27 NOTE — Telephone Encounter (Signed)
Received BCI results of "Not Sufficient".  Test could not be performed.  [place] Getting a copy, placed a copy on Dr. Geralyn Flash desk and took a copy to HIM to scan.

## 2016-02-01 ENCOUNTER — Telehealth: Payer: Self-pay | Admitting: Hematology and Oncology

## 2016-02-01 NOTE — Telephone Encounter (Signed)
I called the patient to discuss that the breast cancer index test There was insufficient tissue to run the test I left a message for the patient

## 2016-02-03 DIAGNOSIS — C50212 Malignant neoplasm of upper-inner quadrant of left female breast: Secondary | ICD-10-CM | POA: Diagnosis not present

## 2016-02-16 ENCOUNTER — Telehealth: Payer: Self-pay | Admitting: Hematology and Oncology

## 2016-02-16 NOTE — Telephone Encounter (Signed)
I called the patient and left a message that breast cancer index came back at low risk category of 2% risk for years 5-10. There is low likelihood of benefit from extended adjuvant therapy. Based on the starting date of tamoxifen of December 2013, she would've completed 5 years of tamoxifen by December 2018. There is no benefit for extended adjuvant therapy beyond 5 years.

## 2016-02-21 ENCOUNTER — Encounter (HOSPITAL_COMMUNITY): Payer: Self-pay

## 2016-04-10 MED FILL — MONTELUKAST SOD 10 MG TAB: 10 | 90 days supply | Qty: 90 | Fill #1

## 2016-04-10 MED FILL — VENLAFAXINE HCL ER 37.5 MG: 37.5 | 90 days supply | Qty: 90 | Fill #1

## 2016-04-10 MED FILL — TAMOXIFEN 20 MG TABLET: 20 | 90 days supply | Qty: 90 | Fill #1

## 2016-04-10 MED FILL — SYNTHROID 75 MCG TABLET: 75 | 90 days supply | Qty: 90 | Fill #1

## 2016-05-16 DIAGNOSIS — H5213 Myopia, bilateral: Secondary | ICD-10-CM | POA: Diagnosis not present

## 2016-05-18 ENCOUNTER — Encounter: Payer: Self-pay | Admitting: Physician Assistant

## 2016-06-13 ENCOUNTER — Encounter: Payer: Self-pay | Admitting: Gynecology

## 2016-06-14 ENCOUNTER — Other Ambulatory Visit: Payer: Self-pay

## 2016-06-14 ENCOUNTER — Other Ambulatory Visit: Payer: Self-pay | Admitting: Hematology and Oncology

## 2016-06-14 DIAGNOSIS — Z853 Personal history of malignant neoplasm of breast: Secondary | ICD-10-CM

## 2016-06-20 ENCOUNTER — Ambulatory Visit (INDEPENDENT_AMBULATORY_CARE_PROVIDER_SITE_OTHER): Payer: 59 | Admitting: Gynecology

## 2016-06-20 ENCOUNTER — Other Ambulatory Visit: Payer: Self-pay | Admitting: Physician Assistant

## 2016-06-20 ENCOUNTER — Encounter: Payer: Self-pay | Admitting: Gynecology

## 2016-06-20 VITALS — BP 120/74 | Ht 66.5 in | Wt 172.0 lb

## 2016-06-20 DIAGNOSIS — Z853 Personal history of malignant neoplasm of breast: Secondary | ICD-10-CM | POA: Diagnosis not present

## 2016-06-20 DIAGNOSIS — Z01419 Encounter for gynecological examination (general) (routine) without abnormal findings: Secondary | ICD-10-CM

## 2016-06-20 LAB — URINALYSIS W MICROSCOPIC + REFLEX CULTURE
BACTERIA UA: NONE SEEN [HPF]
Bilirubin Urine: NEGATIVE
CASTS: NONE SEEN [LPF]
CRYSTALS: NONE SEEN [HPF]
Glucose, UA: NEGATIVE
HGB URINE DIPSTICK: NEGATIVE
KETONES UR: NEGATIVE
Leukocytes, UA: NEGATIVE
Nitrite: NEGATIVE
Protein, ur: NEGATIVE
RBC / HPF: NONE SEEN RBC/HPF (ref <=2)
SQUAMOUS EPITHELIAL / LPF: NONE SEEN [HPF] (ref <=5)
Specific Gravity, Urine: 1.018 (ref 1.001–1.035)
Yeast: NONE SEEN [HPF]
pH: 6 (ref 5.0–8.0)

## 2016-06-20 LAB — CBC WITH DIFFERENTIAL/PLATELET
BASOS PCT: 0 %
Basophils Absolute: 0 cells/uL (ref 0–200)
EOS ABS: 99 {cells}/uL (ref 15–500)
Eosinophils Relative: 3 %
HEMATOCRIT: 37.6 % (ref 35.0–45.0)
HEMOGLOBIN: 12.6 g/dL (ref 11.7–15.5)
LYMPHS ABS: 1221 {cells}/uL (ref 850–3900)
Lymphocytes Relative: 37 %
MCH: 30 pg (ref 27.0–33.0)
MCHC: 33.5 g/dL (ref 32.0–36.0)
MCV: 89.5 fL (ref 80.0–100.0)
MONO ABS: 231 {cells}/uL (ref 200–950)
MPV: 10.1 fL (ref 7.5–12.5)
Monocytes Relative: 7 %
NEUTROS PCT: 53 %
Neutro Abs: 1749 cells/uL (ref 1500–7800)
Platelets: 227 10*3/uL (ref 140–400)
RBC: 4.2 MIL/uL (ref 3.80–5.10)
RDW: 13.1 % (ref 11.0–15.0)
WBC: 3.3 10*3/uL — AB (ref 3.8–10.8)

## 2016-06-20 LAB — COMPREHENSIVE METABOLIC PANEL
ALT: 16 U/L (ref 6–29)
AST: 16 U/L (ref 10–35)
Albumin: 3.9 g/dL (ref 3.6–5.1)
Alkaline Phosphatase: 37 U/L (ref 33–115)
BUN: 13 mg/dL (ref 7–25)
CHLORIDE: 104 mmol/L (ref 98–110)
CO2: 28 mmol/L (ref 20–31)
CREATININE: 0.87 mg/dL (ref 0.50–1.10)
Calcium: 9.3 mg/dL (ref 8.6–10.2)
GLUCOSE: 92 mg/dL (ref 65–99)
POTASSIUM: 4.3 mmol/L (ref 3.5–5.3)
Sodium: 138 mmol/L (ref 135–146)
TOTAL PROTEIN: 6.1 g/dL (ref 6.1–8.1)
Total Bilirubin: 0.3 mg/dL (ref 0.2–1.2)

## 2016-06-20 LAB — LIPID PANEL
CHOL/HDL RATIO: 3.7 ratio (ref <5.0)
CHOLESTEROL: 213 mg/dL — AB (ref <200)
HDL: 57 mg/dL (ref >50)
LDL CALC: 139 mg/dL — AB (ref <100)
Triglycerides: 86 mg/dL (ref <150)
VLDL: 17 mg/dL (ref <30)

## 2016-06-20 LAB — TSH: TSH: 1 m[IU]/L

## 2016-06-20 MED ORDER — DOXYCYCLINE HYCLATE 100 MG PO TABS: 100.0000 mg | ORAL_TABLET | Freq: Two times a day (BID) | ORAL | 0 refills | Status: DC

## 2016-06-20 MED ORDER — MUPIROCIN 2 % EX OINT: 1.0000 "application " | TOPICAL_OINTMENT | Freq: Two times a day (BID) | CUTANEOUS | 0 refills | Status: DC

## 2016-06-20 NOTE — Progress Notes (Signed)
Pt got a blister on the medial aspect of forefoot - she has been keeping it covered but it now has erythema leading distal to the wound.  We will start bactroban but she will have Doxy incase it does not improve.

## 2016-06-20 NOTE — Progress Notes (Signed)
Tina Parks 1970/06/16 [id number]   History:    46 y.o.  for annual gyn exam who has been amenorrheic due to the fact that she has been on tamoxifen for 4-1/2 years for her breast cancer. Her past medical history as follows: Patient with past history of   Past medical history,surgical history, family history and social history were all reviewed and documented in the EPIC chart.  Gynecologic History Patient's last menstrual period was 10/01/2015. Contraception: vasectomy left lumpectomy as a result of screening mammogram having noted a suspicious lesion. 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 Recommendations for a left lumpectomy with sentinel lymph node biopsy. She underwent this on 02/21/2012. Her final pathology revealed a 0.3 cm invasive ductal carcinoma that was grade 1. No lymphovascular invasion margins were -2 sentinel nodes were negative for metastatic disease tumor was ER +100% PR +100% HER-2/neu negative with a Ki-67 3%.   patient had genetic testing and counseling performed. She had a gene BX panel performed for breast and ovarian cancer panel. 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 She was also found to have a variant FAM175a, c.1139A>G (p.Asp380Gly). The patient was negative for BRCA1 and BRCA2 gene mutation.  Patient completed radiation therapy and is now on tamoxifen.She had a bone density study done in 2011 secondary to stress fracture of the left hip and family history of osteoporosis her lowest Z. Score was at the right femoral neck with a value -1.2. She had a repeat in 2016 and a separate facility but based on Z score essentially has remained the same with a value of -1.4 the left femoral neck -1.5 at the right femoral neck.   Last Pap: 2016. Results were: normal Last mammogram: 2017. Results were: normal  Obstetric  History OB History  Gravida Para Term Preterm AB Living  1 1 1     1   SAB TAB Ectopic Multiple Live Births               # Outcome Date GA Lbr Len/2nd Weight Sex Delivery Anes PTL Lv  1 Term             Obstetric Comments  G1, P1, NuvaRing 1990-2013     ROS: A ROS was performed and pertinent positives and negatives are included in the history.  GENERAL: No fevers or chills. HEENT: No change in vision, no earache, sore throat or sinus congestion. NECK: No pain or stiffness. CARDIOVASCULAR: No chest pain or pressure. No palpitations. PULMONARY: No shortness of breath, cough or wheeze. GASTROINTESTINAL: No abdominal pain, nausea, vomiting or diarrhea, melena or bright red blood per rectum. GENITOURINARY: No urinary frequency, urgency, hesitancy or dysuria. MUSCULOSKELETAL: No joint or muscle pain, no back pain, no recent trauma. DERMATOLOGIC: No rash, no itching, no lesions. ENDOCRINE: No polyuria, polydipsia, no heat or cold intolerance. No recent change in weight. HEMATOLOGICAL: No anemia or easy bruising or bleeding. NEUROLOGIC: No headache, seizures, numbness, tingling or weakness. PSYCHIATRIC: No depression, no loss of interest in normal activity or change in sleep pattern.     Exam: chaperone present  BP 120/74   Ht 5' 6.5" (1.689 m)   Wt 172 lb (78 kg)   LMP 10/01/2015   BMI 27.35 kg/m   Body mass index is 27.35 kg/m.  General appearance : Well developed well nourished female. No acute distress HEENT:  Eyes: no retinal hemorrhage or exudates,  Neck supple, trachea midline, no carotid bruits, no thyroidmegaly Lungs: Clear to auscultation, no rhonchi or wheezes, or rib retractions  Heart: Regular rate and rhythm, no murmurs or gallops Breast:Examined in sitting and supine position were symmetrical in appearance, no palpable masses or tenderness,  no skin retraction, no nipple inversion, no nipple discharge, no skin discoloration, no axillary or supraclavicular  lymphadenopathy Abdomen: no palpable masses or tenderness, no rebound or guarding Extremities: no edema or skin discoloration or tenderness  Pelvic:  Bartholin, Urethra, Skene Glands: Within normal limits             Vagina: No gross lesions or discharge  Cervix: No gross lesions or discharge  Uterus   antevertedmal size, shape and consistency, non-tender and mobile  Adnexa  Without masses or tenderness  Anus and perineum  normal   Rectovaginal  normal sphincter tone without palpated masses or tenderness             Hemoccult Not indicated  Assessment/Plan:  46 y.o. female for annual exam will complete her fifth year tamoxifen this November. Patient with past  history of left lumpectomy as a result of an T1 A. N0 invasive ductal carcinoma of the left breast diagnosed in 12/19/2011. Patient is doing well. She was asymptomatic today. She is fasting today the following screening labs will be ordered: Fasting lipid profile, comprehensive metabolic panel, TSH, CBC, and urinalysis. Patient scheduled for mammogram three-dimensional next month. We discussed importance of calcium vitamin D and regular exercise for osteoporosis prevention. Patient will be scheduled for bone density study in the next few weeks.  Terrance Mass MD, 8:58 AM 06/20/2016

## 2016-06-21 MED FILL — MUPIROCIN 2% OINTMENT: 2 | 7 days supply | Qty: 22 | Fill #0

## 2016-06-21 MED FILL — DOXYCYCLINE HYCLATE 100 MG: 100 | 10 days supply | Qty: 20 | Fill #0

## 2016-06-22 LAB — URINE CULTURE: ORGANISM ID, BACTERIA: NO GROWTH

## 2016-07-05 ENCOUNTER — Ambulatory Visit (INDEPENDENT_AMBULATORY_CARE_PROVIDER_SITE_OTHER): Payer: 59

## 2016-07-05 DIAGNOSIS — Z13820 Encounter for screening for osteoporosis: Secondary | ICD-10-CM

## 2016-07-05 DIAGNOSIS — Z853 Personal history of malignant neoplasm of breast: Secondary | ICD-10-CM

## 2016-07-05 DIAGNOSIS — M8589 Other specified disorders of bone density and structure, multiple sites: Secondary | ICD-10-CM

## 2016-07-09 ENCOUNTER — Other Ambulatory Visit: Payer: Self-pay | Admitting: Physician Assistant

## 2016-07-09 ENCOUNTER — Other Ambulatory Visit: Payer: Self-pay | Admitting: Gynecology

## 2016-07-09 DIAGNOSIS — Z13820 Encounter for screening for osteoporosis: Secondary | ICD-10-CM

## 2016-07-09 DIAGNOSIS — M8589 Other specified disorders of bone density and structure, multiple sites: Secondary | ICD-10-CM

## 2016-07-09 DIAGNOSIS — Z853 Personal history of malignant neoplasm of breast: Secondary | ICD-10-CM

## 2016-07-09 MED ORDER — SYNTHROID 75 MCG PO TABS
ORAL_TABLET | ORAL | 1 refills | Status: DC
Start: 2016-07-09 — End: 2017-01-07

## 2016-07-09 MED FILL — MONTELUKAST SOD 10 MG TAB: 10 | 90 days supply | Qty: 90 | Fill #2

## 2016-07-09 MED FILL — SYNTHROID 75 MCG TABLET: 75 | 90 days supply | Qty: 90 | Fill #0

## 2016-07-09 MED FILL — TAMOXIFEN 20 MG TABLET: 20 | 90 days supply | Qty: 90 | Fill #2

## 2016-07-09 MED FILL — VENLAFAXINE HCL ER 37.5 MG: 37.5 | 90 days supply | Qty: 90 | Fill #2

## 2016-07-09 NOTE — Progress Notes (Signed)
Needs Rf of synthroid - TSh was 1 last month at GYN visit.

## 2016-07-16 ENCOUNTER — Ambulatory Visit
Admission: RE | Admit: 2016-07-16 | Discharge: 2016-07-16 | Disposition: A | Discharge status: Home / Self Care | Payer: 59 | Source: Ambulatory Visit | Attending: Hematology and Oncology | Admitting: Hematology and Oncology

## 2016-07-16 DIAGNOSIS — R928 Other abnormal and inconclusive findings on diagnostic imaging of breast: Secondary | ICD-10-CM | POA: Diagnosis not present

## 2016-07-16 DIAGNOSIS — Z853 Personal history of malignant neoplasm of breast: Secondary | ICD-10-CM

## 2016-08-06 DIAGNOSIS — C50212 Malignant neoplasm of upper-inner quadrant of left female breast: Secondary | ICD-10-CM | POA: Diagnosis not present

## 2016-08-13 ENCOUNTER — Encounter: Payer: Self-pay | Admitting: Physician Assistant

## 2016-08-13 ENCOUNTER — Ambulatory Visit (INDEPENDENT_AMBULATORY_CARE_PROVIDER_SITE_OTHER): Payer: 59 | Admitting: Physician Assistant

## 2016-08-13 VITALS — BP 114/75 | HR 78 | Temp 98.2°F | Resp 16 | Ht 66.5 in | Wt 174.0 lb

## 2016-08-13 DIAGNOSIS — J019 Acute sinusitis, unspecified: Secondary | ICD-10-CM | POA: Diagnosis not present

## 2016-08-13 MED ORDER — PREDNISONE 20 MG PO TABS: ORAL_TABLET | ORAL | 0 refills | Status: DC

## 2016-08-13 MED ORDER — AMOXICILLIN-POT CLAVULANATE 875-125 MG PO TABS: 1.0000 | ORAL_TABLET | Freq: Two times a day (BID) | ORAL | 0 refills | Status: DC

## 2016-08-13 MED FILL — predniSONE 20 MG TABS: 20 | 18 days supply | Qty: 18 | Fill #0

## 2016-08-13 MED FILL — AMOX-CLAV 875-125 MG TABLET: 875-125 | 10 days supply | Qty: 20 | Fill #0

## 2016-08-13 NOTE — Progress Notes (Signed)
Tina Parks  MRN: [id number] DOB: [birth year]  PCP: Mancel Bale, PA-C  Chief Complaint  Patient presents with  . Sinus Problem    x1 week nasal drainage and congestion    Subjective:  Pt presents to clinic for cold symtpoms that started over a week ago.  Tickle in her throat started - started to get thicker mucus in her throat without sinus congestion.  Cough started and then got worse - she is coughing from her throat because the mucus is so thick . Ear pressure - increases with nose blowing - thick nasal mucus and thick mucus in her throat - when she blows her nose mucus is coming out of her tear ducts.  She has problems with sinus infections but this does not feel like that currently.  Cough is not keeping her up at night.  She has not used mucinex because it makes her feel bad - she is taking her allergy medications as needed- she has h/o exercise induced asthma but is not currently having problems with that.  Tylenol/motrin Could worse at night - tired from the day  Review of Systems  Constitutional: Negative for chills and fever.  HENT: Positive for congestion, postnasal drip and sore throat (with cough).   Respiratory: Positive for cough. Negative for shortness of breath and wheezing.   Neurological: Positive for headaches.  Psychiatric/Behavioral: Negative for sleep disturbance.    Patient Active Problem List   Diagnosis Date Noted  . Neutropenia ([place]) 07/01/2015  . Hashimoto's thyroiditis 07/01/2014  . Hypothyroidism 06/25/2014  . Leucopenia 06/25/2014  . Low bone mass 06/18/2014  . H/O vitamin D deficiency 06/18/2014  . Hx of radiation therapy   . 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 Primary cancer of upper inner quadrant of left female breast ([place]) 12/17/2011  . Recurrent sinusitis     Current Outpatient Prescriptions on File Prior to Visit  Medication Sig Dispense Refill  . ALPRAZolam (XANAX) 1 MG tablet Take 1 mg by mouth at  bedtime as needed. sleep    . CALCIUM ACETATE-MAGNESIUM CARB PO Take by mouth daily.    . cholecalciferol (VITAMIN D) 1000 units tablet Take 1 tablet (1,000 Units total) by mouth daily.    . montelukast (SINGULAIR) 10 MG tablet Take 1 tablet (10 mg total) by mouth daily. 90 tablet 3  . mupirocin ointment (BACTROBAN) 2 % Apply 1 application topically 2 (two) times daily. 22 g 0  . Omega-3 Fatty Acids (FISH OIL) 1200 MG CAPS Take by mouth.    . SYNTHROID 75 MCG tablet TAKE 1 TABLET BY MOUTH DAILY BEFORE BREAKFAST. 90 tablet 1  . tamoxifen (NOLVADEX) 20 MG tablet Take 1 tablet (20 mg total) by mouth daily. 90 tablet 3  . valACYclovir (VALTREX) 1000 MG tablet Take 2 tablets (2,000 mg total) by mouth as needed. For fever blister 30 tablet 1  . venlafaxine XR (EFFEXOR-XR) 37.5 MG 24 hr capsule Take 1 capsule (37.5 mg total) by mouth daily. 90 capsule 3   No current facility-administered medications on file prior to visit.     No Known Allergies  Pt patients past, family and social history were reviewed and updated.   Objective:  BP 114/75 (BP Location: Right Arm, Patient Position: Sitting, Cuff Size: Normal)   Pulse 78   Temp 98.2 F (36.8 C) (Oral)   Resp 16   Ht 5' 6.5" (1.689 m)   Wt 174 lb (78.9 kg)   LMP  06/09/2016 (Exact Date)   SpO2 96%   BMI 27.66 kg/m   Physical Exam  Constitutional: She is oriented to person, place, and time and well-developed, well-nourished, and in no distress.  HENT:  Head: Normocephalic and atraumatic.  Right Ear: Hearing, tympanic membrane, external ear and ear canal normal.  Left Ear: Hearing, tympanic membrane, external ear and ear canal normal.  Nose: Nose normal. Right sinus exhibits no maxillary sinus tenderness and no frontal sinus tenderness. Left sinus exhibits no maxillary sinus tenderness and no frontal sinus tenderness.  Mouth/Throat: Uvula is midline, oropharynx is clear and moist and mucous membranes are normal.  Eyes: Conjunctivae are  normal.  Neck: Normal range of motion.  Cardiovascular: Normal rate, regular rhythm and normal heart sounds.   No murmur heard. Pulmonary/Chest: Effort normal and breath sounds normal. She has no wheezes.  Deep sounding cough  Neurological: She is alert and oriented to person, place, and time. Gait normal.  Skin: Skin is warm and dry.  Psychiatric: Mood, memory, affect and judgment normal.  Vitals reviewed.   Assessment and Plan :  Acute non-recurrent sinusitis, unspecified location - Plan: amoxicillin-clavulanate (AUGMENTIN) 875-125 MG tablet, predniSONE (DELTASONE) 20 MG tablet   Encouraged to continue home care - start prednisone to treat the inflammation which is likely the cause of her symptoms - if that does not improve she will start augmentin.  Windell Hummingbird PA-C  [facility] 08/15/2016 12:10 PM

## 2016-08-13 NOTE — Patient Instructions (Signed)
     IF you received an x-ray today, you will receive an invoice from [facility]. Please contact [facility] at [phone] with questions or concerns regarding your invoice.   IF you received labwork today, you will receive an invoice from [facility]. Please contact [facility] at [phone] with questions or concerns regarding your invoice.   Our billing staff will not be able to assist you with questions regarding bills from these companies.  You will be contacted with the lab results as soon as they are available. The fastest way to get your results is to activate your My Chart account. Instructions are located on the last page of this paperwork. If you have not heard from us regarding the results in 2 weeks, please contact this office.     

## 2016-08-15 ENCOUNTER — Encounter: Payer: Self-pay | Admitting: Physician Assistant

## 2016-08-29 ENCOUNTER — Other Ambulatory Visit: Payer: Self-pay | Admitting: Physician Assistant

## 2016-08-29 DIAGNOSIS — J019 Acute sinusitis, unspecified: Secondary | ICD-10-CM

## 2016-08-29 MED ORDER — PREDNISONE 20 MG PO TABS: ORAL_TABLET | ORAL | 0 refills | Status: DC

## 2016-08-29 MED FILL — predniSONE 20 MG TABS: 20 | 30 days supply | Qty: 36 | Fill #0

## 2016-08-29 NOTE — Progress Notes (Signed)
Pt got better after her round of augmentin and prednisone but 2 days after the prednsione was stopped the sore throat on the right side returned which was 2 days ago.  Since then the cough has partially come back and her throat is sore at the back of her throat.  Her cough is non-productive and deep again without SOB or wheezing.  She has no problems with heartburn.  She is going on vacation and will use OTC treatment to remove mucus from back of throat.  On exam the soft palate is erythematous and the uvula is edematous and erythematous - no exudate.  Ears were WNL bilaterally.  No lymph nodes palpable in the neck area.  Will send the patient with another round on prednisone to use if the throat does not improve and the cough worsens - she has been covered for Strep and at this time this appears to be a virus.

## 2016-09-02 ENCOUNTER — Telehealth: Payer: Self-pay | Admitting: Physician Assistant

## 2016-09-02 MED ORDER — MOXIFLOXACIN HCL 400 MG PO TABS: 400.0000 mg | ORAL_TABLET | Freq: Every day | ORAL | 0 refills | Status: DC

## 2016-09-02 NOTE — Telephone Encounter (Signed)
Pt is not feeling better - worse sore throat and continued cough - treat for sinus infection.

## 2016-09-03 ENCOUNTER — Telehealth: Payer: Self-pay | Admitting: Physician Assistant

## 2016-09-03 NOTE — Telephone Encounter (Signed)
Avelox not in stock at pharmacy - need new medication

## 2016-10-05 MED FILL — VENLAFAXINE HCL ER 37.5 MG: 37.5 | 90 days supply | Qty: 90 | Fill #3

## 2016-10-05 MED FILL — SYNTHROID 75 MCG TABLET: 75 | 90 days supply | Qty: 90 | Fill #1

## 2016-10-05 MED FILL — MONTELUKAST SOD 10 MG TAB: 10 | 90 days supply | Qty: 90 | Fill #3

## 2016-10-05 MED FILL — TAMOXIFEN CITRATE 20 MG TAB: 20 | 90 days supply | Qty: 90 | Fill #3

## 2016-10-24 ENCOUNTER — Other Ambulatory Visit: Payer: Self-pay | Admitting: Physician Assistant

## 2016-10-24 DIAGNOSIS — E063 Autoimmune thyroiditis: Principal | ICD-10-CM

## 2016-10-24 DIAGNOSIS — E038 Other specified hypothyroidism: Secondary | ICD-10-CM

## 2016-10-31 ENCOUNTER — Ambulatory Visit (HOSPITAL_COMMUNITY)
Admission: RE | Admit: 2016-10-31 | Discharge: 2016-10-31 | Disposition: A | Discharge status: Home / Self Care | Payer: 59 | Source: Ambulatory Visit | Attending: Physician Assistant | Admitting: Physician Assistant

## 2016-10-31 DIAGNOSIS — E063 Autoimmune thyroiditis: Secondary | ICD-10-CM | POA: Diagnosis not present

## 2016-10-31 DIAGNOSIS — E041 Nontoxic single thyroid nodule: Secondary | ICD-10-CM | POA: Insufficient documentation

## 2016-10-31 DIAGNOSIS — E038 Other specified hypothyroidism: Secondary | ICD-10-CM | POA: Diagnosis not present

## 2016-12-07 ENCOUNTER — Other Ambulatory Visit: Payer: Self-pay

## 2016-12-07 ENCOUNTER — Encounter: Payer: Self-pay | Admitting: Physician Assistant

## 2016-12-07 ENCOUNTER — Ambulatory Visit (INDEPENDENT_AMBULATORY_CARE_PROVIDER_SITE_OTHER): Payer: 59 | Admitting: Physician Assistant

## 2016-12-07 VITALS — BP 118/60 | HR 94 | Temp 98.2°F | Resp 16 | Ht 67.0 in | Wt 174.0 lb

## 2016-12-07 DIAGNOSIS — R350 Frequency of micturition: Secondary | ICD-10-CM

## 2016-12-07 DIAGNOSIS — N3001 Acute cystitis with hematuria: Secondary | ICD-10-CM

## 2016-12-07 LAB — POCT URINALYSIS DIP (MANUAL ENTRY)
Bilirubin, UA: NEGATIVE
GLUCOSE UA: NEGATIVE mg/dL
Ketones, POC UA: NEGATIVE mg/dL
NITRITE UA: NEGATIVE
SPEC GRAV UA: 1.02 (ref 1.010–1.025)
UROBILINOGEN UA: 0.2 U/dL
pH, UA: 7 (ref 5.0–8.0)

## 2016-12-07 LAB — POC MICROSCOPIC URINALYSIS (UMFC): MUCUS RE: ABSENT

## 2016-12-07 MED ORDER — PHENAZOPYRIDINE HCL 200 MG PO TABS: 200.0000 mg | ORAL_TABLET | Freq: Three times a day (TID) | ORAL | 0 refills | Status: DC | PRN

## 2016-12-07 MED ORDER — NITROFURANTOIN MONOHYD MACRO 100 MG PO CAPS: 100.0000 mg | ORAL_CAPSULE | Freq: Two times a day (BID) | ORAL | 0 refills | Status: AC

## 2016-12-07 MED FILL — NITROFURANTOIN MONO-MCR 100: 100 | 7 days supply | Qty: 14 | Fill #0

## 2016-12-07 MED FILL — PHENAZOPYRIDINE 200 MG TAB: 200 | 3 days supply | Qty: 10 | Fill #0

## 2016-12-07 NOTE — Progress Notes (Signed)
Tina Parks  MRN: [id number] DOB: 01-18-1971  PCP: Tina Bale, PA-C  Chief Complaint  Patient presents with  . Urinary Frequency  . Dysuria    Subjective:  Pt presents to clinic for urinary frequency and dysuria that got worse this am.  She has been having some intermittent urinary symptoms but they happened and then went away until this am.  In the last 1-2 hours her symptoms have progressively worsened.  She is having no back pain, no N/V/D and no fever or chills.  History is obtained by patient.  Review of Systems  Constitutional: Negative for chills and fever.  Gastrointestinal: Positive for abdominal pain (pressure). Negative for nausea.  Genitourinary: Positive for dysuria, frequency, hematuria and urgency. Negative for flank pain and vaginal discharge.  Musculoskeletal: Negative for back pain.    Patient Active Problem List   Diagnosis Date Noted  . Neutropenia ([place]) 07/01/2015  . Hashimoto's thyroiditis 07/01/2014  . Hypothyroidism 06/25/2014  . Leucopenia 06/25/2014  . Low bone mass 06/18/2014  . H/O vitamin D deficiency 06/18/2014  . Hx of radiation therapy   . 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 Primary cancer of upper inner quadrant of left female breast ([place]) 12/17/2011  . Recurrent sinusitis     Current Outpatient Medications on File Prior to Visit  Medication Sig Dispense Refill  . ALPRAZolam (XANAX) 1 MG tablet Take 1 mg by mouth at bedtime as needed. sleep    . CALCIUM ACETATE-MAGNESIUM CARB PO Take by mouth daily.    . cholecalciferol (VITAMIN D) 1000 units tablet Take 1 tablet (1,000 Units total) by mouth daily.    . montelukast (SINGULAIR) 10 MG tablet Take 1 tablet (10 mg total) by mouth daily. 90 tablet 3  . moxifloxacin (AVELOX) 400 MG tablet Take 1 tablet (400 mg total) by mouth daily. 10 tablet 0  . mupirocin ointment (BACTROBAN) 2 % Apply 1 application topically 2 (two) times daily. 22 g 0  . Omega-3 Fatty  Acids (FISH OIL) 1200 MG CAPS Take by mouth.    . predniSONE (DELTASONE) 20 MG tablet Use as directed 36 tablet 0  . SYNTHROID 75 MCG tablet TAKE 1 TABLET BY MOUTH DAILY BEFORE BREAKFAST. 90 tablet 1  . tamoxifen (NOLVADEX) 20 MG tablet Take 1 tablet (20 mg total) by mouth daily. 90 tablet 3  . valACYclovir (VALTREX) 1000 MG tablet Take 2 tablets (2,000 mg total) by mouth as needed. For fever blister 30 tablet 1  . venlafaxine XR (EFFEXOR-XR) 37.5 MG 24 hr capsule Take 1 capsule (37.5 mg total) by mouth daily. 90 capsule 3   No current facility-administered medications on file prior to visit.     No Known Allergies  Past Medical History:  Diagnosis Date  . Anxiety   . Breast cancer ([place]) 12/13/11   left, ER/PR +, Her 2 -, treated with radiation  . Bronchial spasm    Exercise induced  . CIN I (cervical intraepithelial neoplasia I)   . Depression   . Family history of anesthesia complication    significant PONV  . Hx of radiation therapy 03/31/12 - 04/28/12   left breast  . Sinusitis   . Stress fracture 2011   Left Hip   Social History   Social History Narrative   Married - Tina Parks is her husband (he is a PA)   1 son   PA for [employer]      Social History   Tobacco  Use  . Smoking status: Never Smoker  . Smokeless tobacco: Never Used  Substance Use Topics  . Alcohol use: Yes    Alcohol/week: 4.8 oz    Types: 8 Standard drinks or equivalent per week  . Drug use: No   family history includes Breast cancer in her other; Breast cancer (age of onset: 39) in her maternal grandmother; Dementia in her maternal grandmother; Diabetes in her paternal uncle; Heart disease in her maternal grandmother, paternal grandfather, and paternal grandmother; Hypertension in her maternal grandmother; Pancreatic cancer in her other; Spondylolisthesis in her mother.     Objective:  BP 118/60   Pulse 94   Temp 98.2 F (36.8 C) (Oral)   Resp 16   Ht 5' 7"  (1.702 m)   Wt 174 lb (78.9 kg)    SpO2 100%   BMI 27.25 kg/m  Body mass index is 27.25 kg/m.  Physical Exam  Constitutional: She is oriented to person, place, and time and well-developed, well-nourished, and in no distress.  HENT:  Head: Normocephalic and atraumatic.  Right Ear: Hearing and external ear normal.  Left Ear: Hearing and external ear normal.  Eyes: Conjunctivae are normal.  Neck: Normal range of motion.  Cardiovascular: Normal rate, regular rhythm and normal heart sounds.  No murmur heard. Pulmonary/Chest: Effort normal and breath sounds normal. She has no wheezes.  Neurological: She is alert and oriented to person, place, and time. Gait normal.  Skin: Skin is warm and dry.  Psychiatric: Mood, memory, affect and judgment normal.  Vitals reviewed.  Results for orders placed or performed in visit on 12/07/16  POCT urinalysis dipstick  Result Value Ref Range   Color, UA brown (A) yellow   Clarity, UA cloudy (A) clear   Glucose, UA negative negative mg/dL   Bilirubin, UA negative negative   Ketones, POC UA negative negative mg/dL   Spec Grav, UA 1.020 1.010 - 1.025   Blood, UA large (A) negative   pH, UA 7.0 5.0 - 8.0   Protein Ur, POC =100 (A) negative mg/dL   Urobilinogen, UA 0.2 0.2 or 1.0 E.U./dL   Nitrite, UA Negative Negative   Leukocytes, UA Large (3+) (A) Negative    Assessment and Plan :  Urinary frequency - Plan: POCT urinalysis dipstick, POCT Microscopic Urinalysis ([facility]), Urine Culture  Acute cystitis with hematuria - Plan: nitrofurantoin, macrocrystal-monohydrate, (MACROBID) 100 MG capsule, phenazopyridine (PYRIDIUM) 200 MG tablet  Tina Hummingbird PA-C  [facility] 12/07/2016 8:27 AM

## 2017-01-02 ENCOUNTER — Other Ambulatory Visit: Payer: Self-pay

## 2017-01-02 DIAGNOSIS — C50212 Malignant neoplasm of upper-inner quadrant of left female breast: Secondary | ICD-10-CM

## 2017-01-03 ENCOUNTER — Ambulatory Visit: Payer: 59 | Admitting: Hematology and Oncology

## 2017-01-03 ENCOUNTER — Encounter: Payer: Self-pay | Admitting: Hematology and Oncology

## 2017-01-03 ENCOUNTER — Other Ambulatory Visit (HOSPITAL_BASED_OUTPATIENT_CLINIC_OR_DEPARTMENT_OTHER): Payer: 59

## 2017-01-03 ENCOUNTER — Telehealth: Payer: Self-pay | Admitting: Hematology and Oncology

## 2017-01-03 DIAGNOSIS — C50212 Malignant neoplasm of upper-inner quadrant of left female breast: Secondary | ICD-10-CM | POA: Diagnosis not present

## 2017-01-03 DIAGNOSIS — Z853 Personal history of malignant neoplasm of breast: Secondary | ICD-10-CM | POA: Diagnosis not present

## 2017-01-03 LAB — CBC WITH DIFFERENTIAL/PLATELET
BASO%: 0.3 % (ref 0.0–2.0)
BASOS ABS: 0 10*3/uL (ref 0.0–0.1)
EOS%: 3.2 % (ref 0.0–7.0)
Eosinophils Absolute: 0.1 10*3/uL (ref 0.0–0.5)
HEMATOCRIT: 40.3 % (ref 34.8–46.6)
HGB: 13.4 g/dL (ref 11.6–15.9)
LYMPH#: 1.4 10*3/uL (ref 0.9–3.3)
LYMPH%: 37.6 % (ref 14.0–49.7)
MCH: 30.3 pg (ref 25.1–34.0)
MCHC: 33.3 g/dL (ref 31.5–36.0)
MCV: 91.2 fL (ref 79.5–101.0)
MONO#: 0.3 10*3/uL (ref 0.1–0.9)
MONO%: 8.7 % (ref 0.0–14.0)
NEUT#: 1.9 10*3/uL (ref 1.5–6.5)
NEUT%: 50.2 % (ref 38.4–76.8)
Platelets: 191 10*3/uL (ref 145–400)
RBC: 4.42 10*6/uL (ref 3.70–5.45)
RDW: 13 % (ref 11.2–14.5)
WBC: 3.8 10*3/uL — ABNORMAL LOW (ref 3.9–10.3)

## 2017-01-03 LAB — COMPREHENSIVE METABOLIC PANEL
ALBUMIN: 3.7 g/dL (ref 3.5–5.0)
ALK PHOS: 41 U/L (ref 40–150)
ALT: 19 U/L (ref 0–55)
ANION GAP: 9 meq/L (ref 3–11)
AST: 21 U/L (ref 5–34)
BILIRUBIN TOTAL: 0.28 mg/dL (ref 0.20–1.20)
BUN: 14 mg/dL (ref 7.0–26.0)
CO2: 25 mEq/L (ref 22–29)
CREATININE: 0.9 mg/dL (ref 0.6–1.1)
Calcium: 9.8 mg/dL (ref 8.4–10.4)
Chloride: 107 mEq/L (ref 98–109)
GLUCOSE: 85 mg/dL (ref 70–140)
Potassium: 4.5 mEq/L (ref 3.5–5.1)
SODIUM: 140 meq/L (ref 136–145)
TOTAL PROTEIN: 6.5 g/dL (ref 6.4–8.3)

## 2017-01-03 NOTE — Progress Notes (Signed)
Patient Care Team: Tina Parks as PCP - General Terrance Mass, MD as Consulting Physician (Gynecology)  DIAGNOSIS:  Encounter Diagnosis  Name Primary?  . 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  She is here for routine follow-up and reports no pain or discomfort in the breast.  She denies any lumps or nodules.  Annual mammograms have been coming out well.  She had breast cancer index done which showed that she has very low risk of recurrence and very low likelihood of benefit from extended adjuvant therapy.  REVIEW OF SYSTEMS:   Constitutional: Denies fevers, chills or abnormal weight loss Eyes: Denies blurriness of vision Ears, nose, mouth, throat, and face: Denies mucositis or sore throat Respiratory: Denies cough, dyspnea or wheezes Cardiovascular: Denies palpitation, chest discomfort Gastrointestinal:  Denies nausea, heartburn or change in bowel habits Skin: Denies abnormal skin  rashes Lymphatics: Denies new lymphadenopathy or easy bruising Neurological:Denies numbness, tingling or new weaknesses Behavioral/Psych: Mood is stable, no new changes  Extremities: No lower extremity edema Breast:  denies any pain or lumps or nodules in either breasts All other systems were reviewed with the patient and are negative.  I have reviewed the past medical history, past surgical history, social history and family history with the patient and they are unchanged from previous note.  ALLERGIES:  has No Known Allergies.  MEDICATIONS:  Current Outpatient Medications  Medication Sig Dispense Refill  . ALPRAZolam (XANAX) 1 MG tablet Take 1 mg by mouth at bedtime as needed. sleep    . CALCIUM ACETATE-MAGNESIUM CARB PO Take by mouth daily.    . cholecalciferol (VITAMIN D) 1000 units tablet Take 1 tablet (1,000 Units total) by mouth daily.    . montelukast (SINGULAIR) 10 MG tablet Take 1 tablet (10 mg total) by mouth daily. 90 tablet 3  . Omega-3 Fatty Acids (FISH OIL) 1200 MG CAPS Take by mouth.    . predniSONE (DELTASONE) 20 MG tablet Use as directed 36 tablet 0  . SYNTHROID 75 MCG tablet TAKE 1 TABLET BY MOUTH DAILY BEFORE BREAKFAST. 90 tablet 1  . tamoxifen (NOLVADEX) 20 MG tablet Take 1 tablet (20 mg total) by mouth daily. 90 tablet 3  . valACYclovir (VALTREX) 1000 MG tablet Take 2 tablets (2,000 mg total) by mouth as needed. For fever blister 30 tablet 1  . venlafaxine XR (EFFEXOR-XR) 37.5 MG  24 hr capsule Take 1 capsule (37.5 mg total) by mouth daily. 90 capsule 3   No current facility-administered medications for this visit.     PHYSICAL EXAMINATION: ECOG PERFORMANCE STATUS: 0 - Asymptomatic  Vitals:   01/03/17 0858  BP: 116/77  Pulse: 70  Resp: 13  Temp: 98.1 F (36.7 C)  SpO2: 100%   Filed Weights   01/03/17 0858  Weight: 170 lb 6.4 oz (77.3 kg)    GENERAL:alert, no distress and comfortable SKIN: skin color, texture, turgor are normal, no rashes or  significant lesions EYES: normal, Conjunctiva are pink and non-injected, sclera clear OROPHARYNX:no exudate, no erythema and lips, buccal mucosa, and tongue normal  NECK: supple, thyroid normal size, non-tender, without nodularity LYMPH:  no palpable lymphadenopathy in the cervical, axillary or inguinal LUNGS: clear to auscultation and percussion with normal breathing effort HEART: regular rate & rhythm and no murmurs and no lower extremity edema ABDOMEN:abdomen soft, non-tender and normal bowel sounds MUSCULOSKELETAL:no cyanosis of digits and no clubbing  NEURO: alert & oriented x 3 with fluent speech, no focal motor/sensory deficits EXTREMITIES: No lower extremity edema  LABORATORY DATA:  I have reviewed the data as listed   Chemistry      Component Value Date/Time   NA 140 01/03/2017 0844   K 4.5 01/03/2017 0844   CL 104 06/20/2016 0856   CL 102 05/19/2012 1431   CO2 25 01/03/2017 0844   BUN 14.0 01/03/2017 0844   CREATININE 0.9 01/03/2017 0844      Component Value Date/Time   CALCIUM 9.8 01/03/2017 0844   ALKPHOS 41 01/03/2017 0844   AST 21 01/03/2017 0844   ALT 19 01/03/2017 0844   BILITOT 0.28 01/03/2017 0844       Lab Results  Component Value Date   WBC 3.8 (L) 01/03/2017   HGB 13.4 01/03/2017   HCT 40.3 01/03/2017   MCV 91.2 01/03/2017   PLT 191 01/03/2017   NEUTROABS 1.9 01/03/2017    ASSESSMENT & PLAN:  History of left breast cancer left breast lumpectomy 02/01/2012: 0.3 cm invasive ductal carcinoma grade 1, 0/2 sentinel nodes, ER 100%, PR 100%, HER-2 negative, Ki-67 3% Followed by adjuvant radiation completed 04/28/2012  Patient completed adjuvant tamoxifen therapy for 5 years. Breast cancer index revealed that she has low risk of recurrence of 2% and low likelihood of benefit from extended adjuvant therapy. Based on this information, we decided to discontinue further antiestrogen treatment.  Breast Cancer Surveillance: 1. Breast exam 01/03/2017:  Normal 2. Mammogram  07/16/2016   Leucopenia WBC 3.8 with ANC 1.9; Remainder of cells were normal.   Previous workup:  1. Autoimmune causes: ANA Neg (patient has Hashimoto's thyroiditis)  2. Check P-23 and folic acid: Pending from today  3. Medications could be a cause: Tamoxifen has rarely been found to be associated with leukopenia.  Return to clinic on an as-needed basis  I spent 15 minutes talking to the patient of which more than half was spent in counseling and coordination of care.  No orders of the defined types were placed in this encounter.  The patient has a good understanding of the overall plan. she agrees with it. she will call with any problems that may develop before the next visit here.   Rulon Eisenmenger, MD 01/03/17

## 2017-01-03 NOTE — Assessment & Plan Note (Signed)
left breast lumpectomy 02/01/2012: 0.3 cm invasive ductal carcinoma grade 1, 0/2 sentinel nodes, ER 100%, PR 100%, HER-2 negative, Ki-67 3% Followed by adjuvant radiation completed 04/28/2012  Currently on tamoxifen tolerating it very well, started 05/14/2012  Breast Cancer Surveillance: 1. Breast exam 01/03/2017: Normal 2. Mammogram to be done 07/16/2016   Leucopenia WBC 3.3 with ANC 1.9; Remainder of cells were normal.   Previous workup:  1. Autoimmune causes: ANA Neg (patient has Hashimoto's thyroiditis)  2. Check J-12 and folic acid: Pending from today  3. Medications could be a cause: Tamoxifen has rarely been found to be associated with leukopenia.

## 2017-01-03 NOTE — Telephone Encounter (Signed)
No los at check out

## 2017-01-04 LAB — VITAMIN B12: VITAMIN B 12: 342 pg/mL (ref 232–1245)

## 2017-01-04 LAB — FOLATE: FOLATE: 7.1 ng/mL (ref >3.0)

## 2017-01-06 ENCOUNTER — Encounter: Payer: Self-pay | Admitting: Physician Assistant

## 2017-01-07 ENCOUNTER — Other Ambulatory Visit: Payer: Self-pay | Admitting: Physician Assistant

## 2017-01-07 MED ORDER — SYNTHROID 75 MCG PO TABS: ORAL_TABLET | ORAL | 3 refills | Status: DC

## 2017-01-07 MED ORDER — VENLAFAXINE HCL ER 37.5 MG PO CP24: 37.5000 mg | ORAL_CAPSULE | Freq: Every day | ORAL | 3 refills | Status: DC

## 2017-01-07 MED ORDER — MONTELUKAST SODIUM 10 MG PO TABS: 10.0000 mg | ORAL_TABLET | Freq: Every day | ORAL | 3 refills | Status: DC

## 2017-01-07 MED FILL — MONTELUKAST SOD 10 MG TAB: 10 | 90 days supply | Qty: 90 | Fill #0

## 2017-01-07 MED FILL — VENLAFAXINE HCL ER 37.5 MG: 37.5 | 90 days supply | Qty: 90 | Fill #0

## 2017-01-07 MED FILL — SYNTHROID 75 MCG TABLET: 75 | 90 days supply | Qty: 90 | Fill #0

## 2017-04-01 MED FILL — MONTELUKAST SOD 10 MG TAB: 10 | 90 days supply | Qty: 90 | Fill #1

## 2017-04-01 MED FILL — SYNTHROID 75 MCG TABLET: 75 | 90 days supply | Qty: 90 | Fill #1

## 2017-04-01 MED FILL — VENLAFAXINE HCL ER 37.5 MG: 37.5 | 90 days supply | Qty: 90 | Fill #1

## 2017-04-30 ENCOUNTER — Other Ambulatory Visit: Payer: Self-pay | Admitting: Physician Assistant

## 2017-04-30 MED ORDER — OSELTAMIVIR PHOSPHATE 75 MG PO CAPS: 75.0000 mg | ORAL_CAPSULE | Freq: Every day | ORAL | 0 refills | Status: DC

## 2017-04-30 MED FILL — OSELTAMIVIR PHOSPHATE 75 MG: 75 | 10 days supply | Qty: 10 | Fill #0

## 2017-04-30 NOTE — Progress Notes (Signed)
Meds ordered this encounter  Medications  . oseltamivir (TAMIFLU) 75 MG capsule    Sig: Take 1 capsule (75 mg total) by mouth daily.    Dispense:  10 capsule    Refill:  0    Order Specific Question:   Supervising Provider    Answer:   Wardell Honour [[id number]]

## 2017-06-05 ENCOUNTER — Other Ambulatory Visit: Payer: Self-pay | Admitting: Physician Assistant

## 2017-06-05 ENCOUNTER — Other Ambulatory Visit: Payer: Self-pay | Admitting: Hematology and Oncology

## 2017-06-05 DIAGNOSIS — Z9889 Other specified postprocedural states: Secondary | ICD-10-CM

## 2017-06-11 ENCOUNTER — Encounter: Payer: Self-pay | Admitting: Physician Assistant

## 2017-06-27 ENCOUNTER — Encounter: Payer: Self-pay | Admitting: Obstetrics & Gynecology

## 2017-06-27 ENCOUNTER — Ambulatory Visit (INDEPENDENT_AMBULATORY_CARE_PROVIDER_SITE_OTHER): Payer: 59 | Admitting: Obstetrics & Gynecology

## 2017-06-27 VITALS — BP 128/74 | Ht 66.5 in | Wt 166.0 lb

## 2017-06-27 DIAGNOSIS — Z01419 Encounter for gynecological examination (general) (routine) without abnormal findings: Secondary | ICD-10-CM | POA: Diagnosis not present

## 2017-06-27 DIAGNOSIS — Z780 Asymptomatic menopausal state: Secondary | ICD-10-CM | POA: Diagnosis not present

## 2017-06-27 DIAGNOSIS — M8580 Other specified disorders of bone density and structure, unspecified site: Secondary | ICD-10-CM

## 2017-06-27 DIAGNOSIS — Z853 Personal history of malignant neoplasm of breast: Secondary | ICD-10-CM

## 2017-06-27 DIAGNOSIS — E063 Autoimmune thyroiditis: Secondary | ICD-10-CM

## 2017-06-27 DIAGNOSIS — H5213 Myopia, bilateral: Secondary | ICD-10-CM | POA: Diagnosis not present

## 2017-06-27 NOTE — Progress Notes (Signed)
Tina Parks [birth year] [id number]   History:    47 y.o. G1P1L1 Married.  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  RP:  Established patient presenting for annual gyn exam   HPI: Left breast Ca, invasive ductal Ca, stage 1/grade 1 with ER/PR pos in 2014.  Had Left Lumpectomy and Radiation therapy.  Genetic screening BrCa 1 and 2 negative.  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   Was on Tamoxifen x 5 yrs, stopped 12/2016.  No menses since then and having hot flushes and night sweats.  No pelvic pain.  No pain with IC. Urine/BMs wnl.  Breasts normal.  BMI 26.39.  Fit with [employer] 3x per week.  Fasting Health Labs today.  Past medical history,surgical history, family history and social history were all reviewed and documented in the EPIC chart.  Gynecologic History Patient's last menstrual period was 10/02/2015. Contraception: vasectomy Last Pap: 05/2014. Results were: Negative, HPV HR neg Last mammogram: 06/2016 Results were: Benign Bone Density: Osteopenia 06/2016 Lowest T-Score -1.8 at Left Femoral Neck Colonoscopy: Never.  No Fam h/o Colon Ca.  Obstetric History OB History  Gravida Para Term Preterm AB Living  1 1 1     1   SAB TAB Ectopic Multiple Live Births               # Outcome Date GA Lbr Len/2nd Weight Sex Delivery Anes PTL Lv  1 Term             Obstetric Comments  G1, P1, NuvaRing 1990-2013     ROS: A ROS was performed and pertinent positives and negatives are included in the history.  GENERAL: No fevers or chills. HEENT: No change in vision, no earache, sore throat or sinus congestion. NECK: No pain or stiffness. CARDIOVASCULAR: No chest pain or pressure. No palpitations. PULMONARY: No shortness of breath, cough or wheeze. GASTROINTESTINAL: No abdominal pain, nausea, vomiting or diarrhea, melena or bright red blood per rectum. GENITOURINARY: No urinary frequency, urgency, hesitancy or dysuria. MUSCULOSKELETAL: No joint or muscle pain, no back pain, no  recent trauma. DERMATOLOGIC: No rash, no itching, no lesions. ENDOCRINE: No polyuria, polydipsia, no heat or cold intolerance. No recent change in weight. HEMATOLOGICAL: No anemia or easy bruising or bleeding. NEUROLOGIC: No headache, seizures, numbness, tingling or weakness. PSYCHIATRIC: No depression, no loss of interest in normal activity or change in sleep pattern.     Exam:   BP 128/74   Ht 5' 6.5" (1.689 m)   Wt 166 lb (75.3 kg)   LMP 10/02/2015   BMI 26.39 kg/m   Body mass index is 26.39 kg/m.  General appearance : Well developed well nourished female. No acute distress HEENT: Eyes: no retinal hemorrhage or exudates,  Neck supple, trachea midline, no carotid bruits, no thyroidmegaly Lungs: Clear to auscultation, no rhonchi or wheezes, or rib retractions  Heart: Regular rate and rhythm, no murmurs or gallops Breast:Examined in sitting and supine position were symmetrical in appearance, no palpable masses or tenderness,  no skin retraction, no nipple inversion, no nipple discharge, no skin discoloration, no axillary or supraclavicular lymphadenopathy Abdomen: no palpable masses or tenderness, no rebound or guarding Extremities: no edema or skin discoloration or tenderness  Pelvic: Vulva: Normal             Vagina: No gross lesions or discharge  Cervix: No gross lesions or discharge.  Pap reflex done.  Uterus  AV, normal size, shape and consistency,  non-tender and mobile  Adnexa  Without masses or tenderness  Anus: Normal   Assessment/Plan:  47 y.o. female for annual exam   1. Encounter for routine gynecological examination with Papanicolaou smear of cervix Normal gynecologic exam.  Pap reflex done.  Breast exam normal.  Fasting health labs done today.  Body mass index 26.39.  Patient in excellent fitness and good nutrition. - CBC - Comp Met (CMET) - Lipid panel - TSH - VITAMIN D 25 Hydroxy (Vit-D Deficiency, Fractures)  2. Menopause present Amenorrhea for 6 months  after stopping tamoxifen.  Vasomotor symptoms of menopause present.  Therefore probably in menopause.  Will follow up with a pelvic ultrasound to evaluate the endometrial line as well as her ovaries.  3. Hashimoto's thyroiditis Currently hypothyroidism treated with Synthroid. - TSH  4. Low bone mass Osteopenia with lowest T score at -1.8 in June 2018.  Will check vitamin D today.  Vitamin D supplements, calcium rich nutrition and regular weightbearing physical activity to continue. - VITAMIN D 25 Hydroxy (Vit-D Deficiency, Fractures)  5. History of left breast cancer Diagnosis of invasive ductal carcinoma of the left breast in 2014.  Status post left lumpectomy and radiation therapy as well as tamoxifen.  Genetic screening showed negative for BRCA 1 and 2.  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  Patient will follow-up for pelvic ultrasound for ovarian screening. - US Transvaginal Non-OB; Future  Princess Bruins MD, 2:23 PM 06/27/2017

## 2017-06-27 NOTE — Patient Instructions (Signed)
1. Encounter for routine gynecological examination with Papanicolaou smear of cervix Normal gynecologic exam.  Pap reflex done.  Breast exam normal.  Fasting health labs done today.  Body mass index 26.39.  Patient in excellent fitness and good nutrition. - CBC - Comp Met (CMET) - Lipid panel - TSH - VITAMIN D 25 Hydroxy (Vit-D Deficiency, Fractures)  2. Menopause present Amenorrhea for 6 months after stopping tamoxifen.  Vasomotor symptoms of menopause present.  Therefore probably in menopause.  Will follow up with a pelvic ultrasound to evaluate the endometrial line as well as her ovaries.  3. Hashimoto's thyroiditis Currently hypothyroidism treated with Synthroid. - TSH  4. Low bone mass Osteopenia with lowest T score at -1.8 in June 2018.  Will check vitamin D today.  Vitamin D supplements, calcium rich nutrition and regular weightbearing physical activity to continue. - VITAMIN D 25 Hydroxy (Vit-D Deficiency, Fractures)  5. History of left breast cancer Diagnosis of invasive ductal carcinoma of the left breast in 2014.  Status post left lumpectomy and radiation therapy as well as tamoxifen.  Genetic screening showed negative for BRCA 1 and 2.  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  Patient will follow-up for pelvic ultrasound for ovarian screening. - US Transvaginal Non-OB; Future  Jaclene, it was very nice seeing you today!  I will inform you of your results as soon as they are available.

## 2017-06-27 NOTE — Addendum Note (Signed)
Addended by: Thurnell Garbe A on: 06/27/2017 03:05 PM   Modules accepted: Orders

## 2017-06-28 LAB — COMPREHENSIVE METABOLIC PANEL
AG RATIO: 2.1 (calc) (ref 1.0–2.5)
ALT: 24 U/L (ref 6–29)
AST: 23 U/L (ref 10–35)
Albumin: 4.4 g/dL (ref 3.6–5.1)
Alkaline phosphatase (APISO): 52 U/L (ref 33–115)
BILIRUBIN TOTAL: 0.6 mg/dL (ref 0.2–1.2)
BUN: 12 mg/dL (ref 7–25)
CO2: 28 mmol/L (ref 20–32)
Calcium: 9.9 mg/dL (ref 8.6–10.2)
Chloride: 101 mmol/L (ref 98–110)
Creat: 0.91 mg/dL (ref 0.50–1.10)
GLUCOSE: 88 mg/dL (ref 65–99)
Globulin: 2.1 g/dL (calc) (ref 1.9–3.7)
Potassium: 4 mmol/L (ref 3.5–5.3)
Sodium: 138 mmol/L (ref 135–146)
Total Protein: 6.5 g/dL (ref 6.1–8.1)

## 2017-06-28 LAB — PAP IG W/ RFLX HPV ASCU

## 2017-06-28 LAB — CBC
HEMATOCRIT: 38.2 % (ref 35.0–45.0)
HEMOGLOBIN: 13.1 g/dL (ref 11.7–15.5)
MCH: 30.4 pg (ref 27.0–33.0)
MCHC: 34.3 g/dL (ref 32.0–36.0)
MCV: 88.6 fL (ref 80.0–100.0)
MPV: 11.3 fL (ref 7.5–12.5)
Platelets: 216 10*3/uL (ref 140–400)
RBC: 4.31 10*6/uL (ref 3.80–5.10)
RDW: 12.1 % (ref 11.0–15.0)
WBC: 4.7 10*3/uL (ref 3.8–10.8)

## 2017-06-28 LAB — LIPID PANEL
Cholesterol: 237 mg/dL — ABNORMAL HIGH (ref <200)
HDL: 77 mg/dL (ref >50)
LDL Cholesterol (Calc): 142 mg/dL (calc) — ABNORMAL HIGH
Non-HDL Cholesterol (Calc): 160 mg/dL (calc) — ABNORMAL HIGH (ref <130)
TRIGLYCERIDES: 78 mg/dL (ref <150)
Total CHOL/HDL Ratio: 3.1 (calc) (ref <5.0)

## 2017-06-28 LAB — VITAMIN D 25 HYDROXY (VIT D DEFICIENCY, FRACTURES): Vit D, 25-Hydroxy: 40 ng/mL (ref 30–100)

## 2017-06-28 LAB — TSH: TSH: 0.76 m[IU]/L

## 2017-07-04 MED FILL — MONTELUKAST SOD 10 MG TAB: 10 | 90 days supply | Qty: 90 | Fill #2

## 2017-07-04 MED FILL — VENLAFAXINE HCL ER 37.5 MG: 37.5 | 90 days supply | Qty: 90 | Fill #2

## 2017-07-04 MED FILL — SYNTHROID 75 MCG TABLET: 75 | 90 days supply | Qty: 90 | Fill #2

## 2017-07-09 ENCOUNTER — Other Ambulatory Visit: Payer: Self-pay | Admitting: Physician Assistant

## 2017-07-09 DIAGNOSIS — Z23 Encounter for immunization: Secondary | ICD-10-CM

## 2017-07-09 MED ORDER — TYPHOID VACCINE PO CPDR: 1.0000 | DELAYED_RELEASE_CAPSULE | ORAL | 0 refills | Status: AC

## 2017-07-09 MED FILL — VIVOTIF EC CAPSULE: 8 days supply | Qty: 4 | Fill #0

## 2017-07-09 NOTE — Progress Notes (Signed)
Needs typhoid and Hep A vaccination

## 2017-07-22 ENCOUNTER — Ambulatory Visit
Admission: RE | Admit: 2017-07-22 | Discharge: 2017-07-22 | Disposition: A | Discharge status: Home / Self Care | Payer: 59 | Source: Ambulatory Visit | Attending: Physician Assistant | Admitting: Physician Assistant

## 2017-07-22 DIAGNOSIS — Z853 Personal history of malignant neoplasm of breast: Secondary | ICD-10-CM | POA: Diagnosis not present

## 2017-07-22 DIAGNOSIS — Z9889 Other specified postprocedural states: Secondary | ICD-10-CM

## 2017-07-22 DIAGNOSIS — R922 Inconclusive mammogram: Secondary | ICD-10-CM | POA: Diagnosis not present

## 2017-07-22 HISTORY — DX: Personal history of irradiation: Z92.3

## 2017-09-11 ENCOUNTER — Other Ambulatory Visit: Payer: 59

## 2017-09-11 ENCOUNTER — Ambulatory Visit: Payer: 59 | Admitting: Obstetrics & Gynecology

## 2017-10-02 ENCOUNTER — Telehealth: Payer: Self-pay | Admitting: Physician Assistant

## 2017-10-02 ENCOUNTER — Other Ambulatory Visit: Payer: Self-pay | Admitting: Physician Assistant

## 2017-10-02 MED ORDER — SYNTHROID 75 MCG PO TABS: ORAL_TABLET | ORAL | 3 refills | Status: AC

## 2017-10-02 MED ORDER — VENLAFAXINE HCL ER 37.5 MG PO CP24: 37.5000 mg | ORAL_CAPSULE | Freq: Every day | ORAL | 3 refills | Status: AC

## 2017-10-02 MED ORDER — MONTELUKAST SODIUM 10 MG PO TABS: 10.0000 mg | ORAL_TABLET | Freq: Every day | ORAL | 3 refills | Status: AC

## 2017-10-02 NOTE — Progress Notes (Unsigned)
Needs refills on her medications

## 2017-10-02 NOTE — Telephone Encounter (Signed)
Needs Rx refills

## 2017-10-14 ENCOUNTER — Encounter: Payer: Self-pay | Admitting: Physician Assistant

## 2017-10-16 ENCOUNTER — Other Ambulatory Visit: Payer: Self-pay | Admitting: *Deleted

## 2017-10-16 DIAGNOSIS — E039 Hypothyroidism, unspecified: Secondary | ICD-10-CM

## 2017-10-16 MED ORDER — LEVOTHYROXINE SODIUM 75 MCG PO TABS: 75.0000 ug | ORAL_TABLET | Freq: Every day | ORAL | 3 refills | Status: DC

## 2017-10-16 NOTE — Telephone Encounter (Signed)
PA SENT  Key: [id number]

## 2017-10-17 NOTE — Telephone Encounter (Signed)
Levada Dy with [facility] called to check on status of Prior Authorization for levothyroxine (SYNTHROID, LEVOTHROID) 75 MCG tablet

## 2017-10-18 ENCOUNTER — Other Ambulatory Visit: Payer: Self-pay | Admitting: *Deleted

## 2017-10-18 DIAGNOSIS — E039 Hypothyroidism, unspecified: Secondary | ICD-10-CM

## 2017-10-18 MED ORDER — LEVOTHYROXINE SODIUM 75 MCG PO TABS: 75.0000 ug | ORAL_TABLET | Freq: Every day | ORAL | 3 refills | Status: AC

## 2018-07-02 ENCOUNTER — Encounter: Payer: 59 | Admitting: Obstetrics & Gynecology

## 2018-07-03 ENCOUNTER — Other Ambulatory Visit: Payer: Self-pay | Admitting: Physician Assistant

## 2018-07-03 DIAGNOSIS — Z853 Personal history of malignant neoplasm of breast: Secondary | ICD-10-CM

## 2018-07-03 DIAGNOSIS — Z1231 Encounter for screening mammogram for malignant neoplasm of breast: Secondary | ICD-10-CM

## 2018-08-04 ENCOUNTER — Other Ambulatory Visit: Payer: Self-pay | Admitting: Obstetrics & Gynecology

## 2018-08-04 DIAGNOSIS — Z853 Personal history of malignant neoplasm of breast: Secondary | ICD-10-CM

## 2018-08-05 ENCOUNTER — Ambulatory Visit
Admission: RE | Admit: 2018-08-05 | Discharge: 2018-08-05 | Disposition: A | Discharge status: Home / Self Care | Payer: Managed Care, Other (non HMO) | Source: Ambulatory Visit | Attending: Physician Assistant | Admitting: Physician Assistant

## 2018-08-05 DIAGNOSIS — Z853 Personal history of malignant neoplasm of breast: Secondary | ICD-10-CM

## 2019-05-05 ENCOUNTER — Encounter (INDEPENDENT_AMBULATORY_CARE_PROVIDER_SITE_OTHER): Payer: Self-pay

## 2019-09-04 IMAGING — [facility] DIGITAL DIAGNOSTIC BILATERAL MAMMOGRAM WITH TOMO AND CAD
6 of 9 series · 6 of 25 positions shown · non-contrast
Comparison: Previous exam(s).

CLINICAL DATA: 48-year-old female presenting for routine annual
surveillance status post left breast lumpectomy in 6797. The patient
is asymptomatic.

EXAM:
DIGITAL DIAGNOSTIC BILATERAL MAMMOGRAM WITH CAD AND TOMO

[L CC]
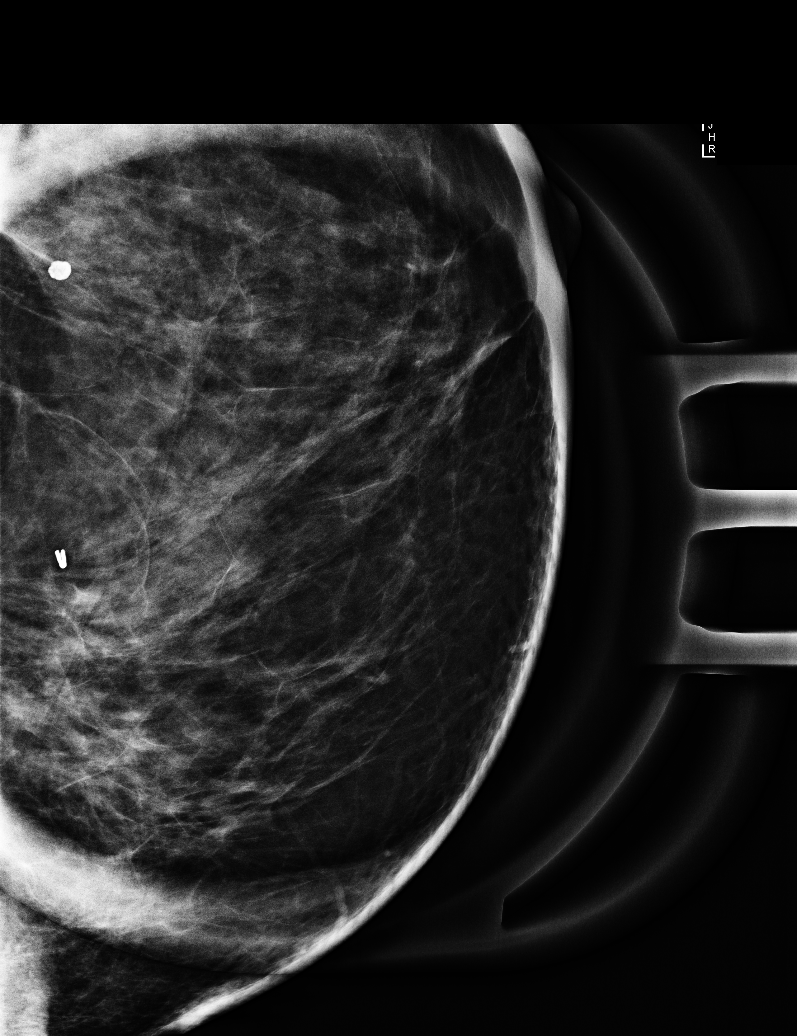

[L MLO synth-2D]
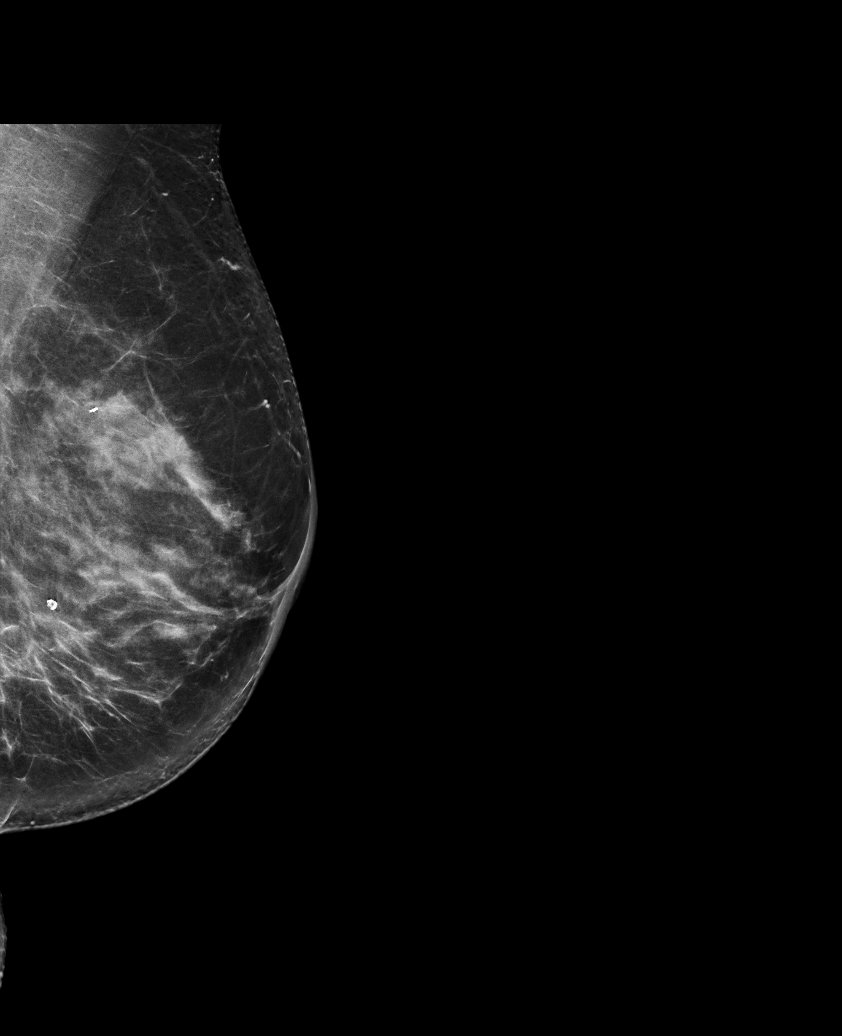

[R CC synth-2D]
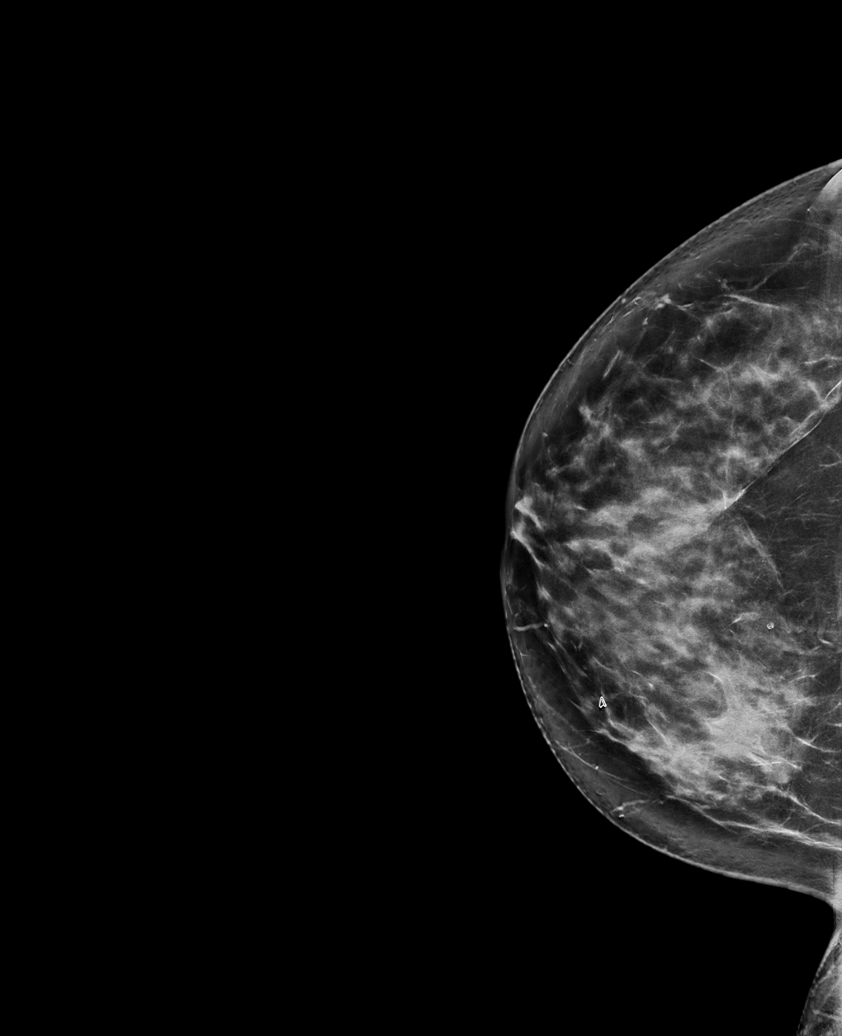

[R MLO synth-2D]
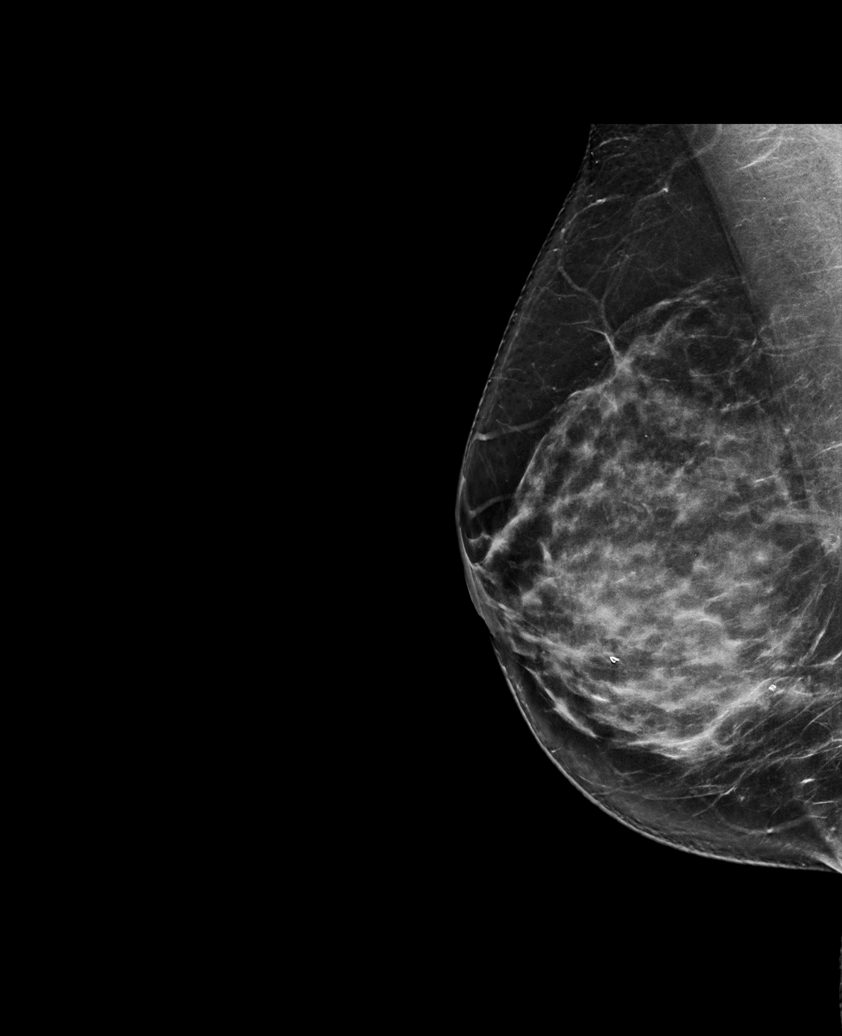

[L CC synth-2D]
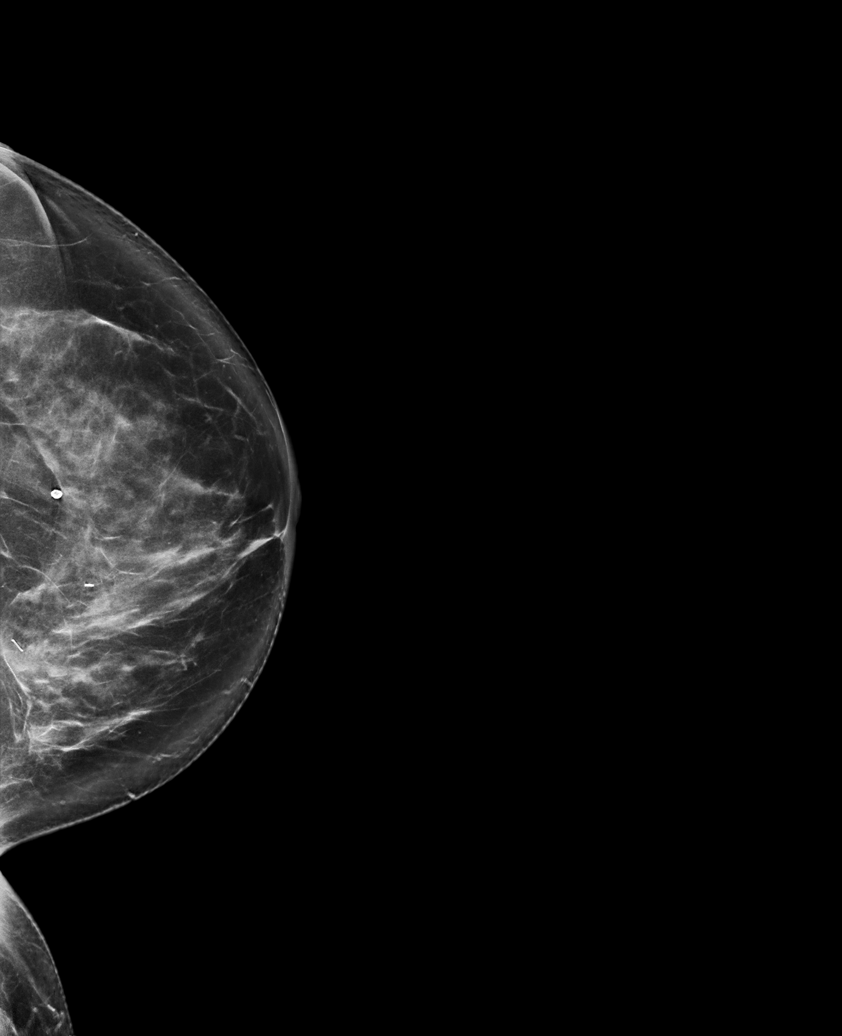

[R MLO tomo · tomo slice 43/85.0]
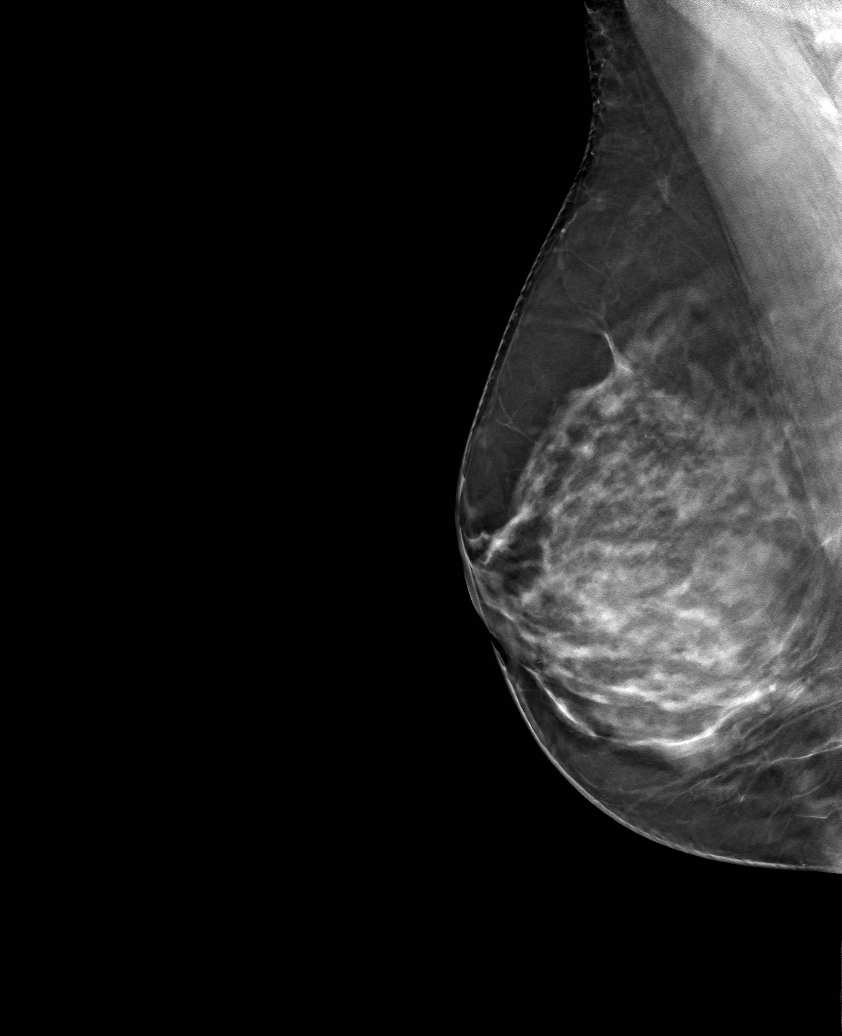

[6 of 25 positions shown; findings below may reference images not displayed]

ACR Breast Density Category c: The breast tissue is heterogeneously
dense, which may obscure small masses.
FINDINGS: The left breast lumpectomy site is stable. No suspicious
calcifications, masses or areas of distortion are seen in the
bilateral breasts.

Mammographic images were processed with CAD.
IMPRESSION: Stable left breast lumpectomy site. No mammographic evidence of
malignancy in the bilateral breasts.

RECOMMENDATION:
Screening mammogram in one year.(Code:[id number])

I have discussed the findings and recommendations with the patient.
Results were also provided in writing at the conclusion of the
visit. If applicable, a reminder letter will be sent to the patient
regarding the next appointment.

BI-RADS CATEGORY  2: Benign.
